# Patient Record
Sex: Female | Born: 1985 | Race: Black or African American | Hispanic: No | Marital: Single | Smoking: Former smoker
Health system: Southern US, Community
[De-identification: ages and names within clinical notes are randomized; demographics above are authoritative.]

## PROBLEM LIST (undated history)

## (undated) ENCOUNTER — Inpatient Hospital Stay (HOSPITAL_COMMUNITY): Payer: Self-pay

## (undated) DIAGNOSIS — A6009 Herpesviral infection of other urogenital tract: Secondary | ICD-10-CM

## (undated) DIAGNOSIS — Z789 Other specified health status: Secondary | ICD-10-CM

## (undated) HISTORY — DX: Herpesviral infection of other urogenital tract: A60.09

## (undated) HISTORY — PX: NO PAST SURGERIES: SHX2092

---

## 2015-09-02 ENCOUNTER — Encounter (HOSPITAL_COMMUNITY): Payer: Self-pay | Admitting: *Deleted

## 2015-09-02 ENCOUNTER — Inpatient Hospital Stay (HOSPITAL_COMMUNITY)
Admission: AD | Admit: 2015-09-02 | Discharge: 2015-09-02 | Disposition: A | Discharge status: Home / Self Care | Payer: Self-pay | Source: Ambulatory Visit | Attending: Obstetrics & Gynecology | Admitting: Obstetrics & Gynecology

## 2015-09-02 ENCOUNTER — Inpatient Hospital Stay (HOSPITAL_COMMUNITY): Payer: Self-pay

## 2015-09-02 DIAGNOSIS — O209 Hemorrhage in early pregnancy, unspecified: Secondary | ICD-10-CM

## 2015-09-02 DIAGNOSIS — O4691 Antepartum hemorrhage, unspecified, first trimester: Secondary | ICD-10-CM | POA: Insufficient documentation

## 2015-09-02 DIAGNOSIS — O3680X0 Pregnancy with inconclusive fetal viability, not applicable or unspecified: Secondary | ICD-10-CM | POA: Insufficient documentation

## 2015-09-02 DIAGNOSIS — Z87891 Personal history of nicotine dependence: Secondary | ICD-10-CM | POA: Insufficient documentation

## 2015-09-02 DIAGNOSIS — Z91018 Allergy to other foods: Secondary | ICD-10-CM | POA: Insufficient documentation

## 2015-09-02 DIAGNOSIS — Z3A01 Less than 8 weeks gestation of pregnancy: Secondary | ICD-10-CM | POA: Insufficient documentation

## 2015-09-02 DIAGNOSIS — R109 Unspecified abdominal pain: Secondary | ICD-10-CM | POA: Insufficient documentation

## 2015-09-02 HISTORY — DX: Other specified health status: Z78.9

## 2015-09-02 LAB — CBC
HCT: 39.8 % (ref 36.0–46.0)
HEMOGLOBIN: 13.5 g/dL (ref 12.0–15.0)
MCH: 30.1 pg (ref 26.0–34.0)
MCHC: 33.9 g/dL (ref 30.0–36.0)
MCV: 88.8 fL (ref 78.0–100.0)
PLATELETS: 299 10*3/uL (ref 150–400)
RBC: 4.48 MIL/uL (ref 3.87–5.11)
RDW: 12.8 % (ref 11.5–15.5)
WBC: 8.5 10*3/uL (ref 4.0–10.5)

## 2015-09-02 LAB — URINALYSIS, ROUTINE W REFLEX MICROSCOPIC
BILIRUBIN URINE: NEGATIVE
GLUCOSE, UA: NEGATIVE mg/dL
Ketones, ur: NEGATIVE mg/dL
Nitrite: NEGATIVE
PROTEIN: NEGATIVE mg/dL
SPECIFIC GRAVITY, URINE: 1.025 (ref 1.005–1.030)
pH: 5.5 (ref 5.0–8.0)

## 2015-09-02 LAB — POCT PREGNANCY, URINE: Preg Test, Ur: POSITIVE — AB

## 2015-09-02 LAB — WET PREP, GENITAL
CLUE CELLS WET PREP: NONE SEEN
Sperm: NONE SEEN
TRICH WET PREP: NONE SEEN
Yeast Wet Prep HPF POC: NONE SEEN

## 2015-09-02 LAB — URINE MICROSCOPIC-ADD ON: BACTERIA UA: NONE SEEN

## 2015-09-02 LAB — HCG, QUANTITATIVE, PREGNANCY: hCG, Beta Chain, Quant, S: 299 m[IU]/mL — ABNORMAL HIGH (ref <5)

## 2015-09-02 NOTE — MAU Provider Note (Signed)
Chief Complaint: Vaginal Bleeding and Abdominal Pain   First Provider Initiated Contact with Patient 09/02/15 1956     SUBJECTIVE HPI HPI: Kristy James is a 29 y.o. G3P0 at [redacted]w[redacted]d by LMP who presents to maternity admissions reporting bleeding which started today. Initially spotting then heavier.  Denies pain.  Was not trying to get pregnant.  She denies vaginal bleeding, vaginal itching/burning, urinary symptoms, h/a, dizziness, n/v, or fever/chills.    RN Note: Pos HPT last week, started bleeding today, also cramping. Has to wear a panti liner, bleeding like a light period.         Past Medical History  Diagnosis Date  . Medical history non-contributory    Past Surgical History  Procedure Laterality Date  . No past surgeries     Social History   Social History  . Marital Status: Single    Spouse Name: N/A  . Number of Children: N/A  . Years of Education: N/A   Occupational History  . Not on file.   Social History Main Topics  . Smoking status: Former Research scientist (life sciences)  . Smokeless tobacco: Not on file  . Alcohol Use: No  . Drug Use: No  . Sexual Activity: Not on file   Other Topics Concern  . Not on file   Social History Narrative  . No narrative on file   No current facility-administered medications on file prior to encounter.   No current outpatient prescriptions on file prior to encounter.   Allergies  Allergen Reactions  . Avocado Hives    ROS:  Review of Systems Review of Systems  Constitutional: Negative for fever and chills.  Gastrointestinal: Negative for nausea, vomiting, abdominal pain, diarrhea and constipation.  Genitourinary: Negative for dysuria. Positive for bleeding, negative for pain. Musculoskeletal: Negative for back pain.  Neurological: Negative for dizziness and weakness.    I have reviewed patient's Past Medical Hx, Surgical Hx, Family Hx, Social Hx, medications and allergies.   Physical Exam  Patient Vitals for the past 24 hrs:  BP  Temp Temp src Pulse Resp Height Weight  09/02/15 1827 119/62 mmHg 98.3 F (36.8 C) Oral 73 16 5\' 3"  (1.6 m) 155 lb 3.2 oz (70.398 kg)    Physical Exam  Constitutional: Well-developed, well-nourished female in no acute distress.  Cardiovascular: normal rate Respiratory: normal effort GI: Abd soft, non-tender. Pos BS x 4 MS: Extremities nontender, no edema, normal ROM Neurologic: Alert and oriented x 4.  GU: Neg CVAT.  PELVIC EXAM: Cervix pink, visually closed, without lesion, small amount of  Bloody discharge, vaginal walls and external genitalia normal Bimanual exam: Cervix 0/long/high, firm, anterior, neg CMT, uterus nontender, nonenlarged, adnexa without tenderness, enlargement, or mass   LAB RESULTS Results for orders placed or performed during the hospital encounter of 09/02/15 (from the past 24 hour(s))  Urinalysis, Routine w reflex microscopic (not at [facility])     Status: Abnormal   Collection Time: 09/02/15  6:30 PM  Result Value Ref Range   Color, Urine YELLOW YELLOW   APPearance CLEAR CLEAR   Specific Gravity, Urine 1.025 1.005 - 1.030   pH 5.5 5.0 - 8.0   Glucose, UA NEGATIVE NEGATIVE mg/dL   Hgb urine dipstick LARGE (A) NEGATIVE   Bilirubin Urine NEGATIVE NEGATIVE   Ketones, ur NEGATIVE NEGATIVE mg/dL   Protein, ur NEGATIVE NEGATIVE mg/dL   Nitrite NEGATIVE NEGATIVE   Leukocytes, UA SMALL (A) NEGATIVE  Urine microscopic-add on     Status: Abnormal   Collection Time: 09/02/15  6:30 PM  Result Value Ref Range   Squamous Epithelial / LPF 0-5 (A) NONE SEEN   WBC, UA 0-5 0 - 5 WBC/hpf   RBC / HPF 6-30 0 - 5 RBC/hpf   Bacteria, UA NONE SEEN NONE SEEN  Pregnancy, urine POC     Status: Abnormal   Collection Time: 09/02/15  6:40 PM  Result Value Ref Range   Preg Test, Ur POSITIVE (A) NEGATIVE  CBC     Status: None   Collection Time: 09/02/15  8:27 PM  Result Value Ref Range   WBC 8.5 4.0 - 10.5 K/uL   RBC 4.48 3.87 - 5.11 MIL/uL   Hemoglobin 13.5 12.0 - 15.0 g/dL    HCT 39.8 36.0 - 46.0 %   MCV 88.8 78.0 - 100.0 fL   MCH 30.1 26.0 - 34.0 pg   MCHC 33.9 30.0 - 36.0 g/dL   RDW 12.8 11.5 - 15.5 %   Platelets 299 150 - 400 K/uL  ABO/Rh     Status: None (Preliminary result)   Collection Time: 09/02/15  8:27 PM  Result Value Ref Range   ABO/RH(D) B POS   Wet prep, genital     Status: Abnormal   Collection Time: 09/02/15  8:30 PM  Result Value Ref Range   Yeast Wet Prep HPF POC NONE SEEN NONE SEEN   Trich, Wet Prep NONE SEEN NONE SEEN   Clue Cells Wet Prep HPF POC NONE SEEN NONE SEEN   WBC, Wet Prep HPF POC FEW (A) NONE SEEN   Sperm NONE SEEN     --/--/B POS (12/29 2027)  IMAGING US Ob Comp Less 14 Wks  09/02/2015  CLINICAL DATA:  29 year old female with vaginal bleeding. EXAM: OBSTETRIC <14 WK [place] AND TRANSVAGINAL OB US TECHNIQUE: Both transabdominal and transvaginal ultrasound examinations were performed for complete evaluation of the gestation as well as the maternal uterus, adnexal regions, and pelvic cul-de-sac. Transvaginal technique was performed to assess early pregnancy. COMPARISON:  None. FINDINGS: The uterus is anteverted. There is a 1.1 x 1.2 x 1.1 cm posterior fibroid. The endometrium measures 4 mm in thickness. No intrauterine pregnancy identified. The HCG levels are pending at this time. With a positive HCG and no documented intrauterine pregnancy, the possibility of an ectopic pregnancy is not excluded. Correlation with serial HCG levels and follow-up with ultrasound is recommended. The ovaries appear unremarkable. The right ovary measures 1.8 x 3.1 x 1.8 cm and the left ovary measures 2.6 x 1.4 x 2.1 cm. No significant free fluid noted within the pelvis. IMPRESSION: No intrauterine pregnancy identified. Correlation with clinical exam and follow-up with serial HCG and ultrasound recommended. Electronically Signed   By: Anner Crete M.D.   On: 09/02/2015 21:25   US Ob Transvaginal  09/02/2015  CLINICAL DATA:  29 year old female with  vaginal bleeding. EXAM: OBSTETRIC <14 WK [place] AND TRANSVAGINAL OB US TECHNIQUE: Both transabdominal and transvaginal ultrasound examinations were performed for complete evaluation of the gestation as well as the maternal uterus, adnexal regions, and pelvic cul-de-sac. Transvaginal technique was performed to assess early pregnancy. COMPARISON:  None. FINDINGS: The uterus is anteverted. There is a 1.1 x 1.2 x 1.1 cm posterior fibroid. The endometrium measures 4 mm in thickness. No intrauterine pregnancy identified. The HCG levels are pending at this time. With a positive HCG and no documented intrauterine pregnancy, the possibility of an ectopic pregnancy is not excluded. Correlation with serial HCG levels and follow-up with ultrasound is recommended. The ovaries appear  unremarkable. The right ovary measures 1.8 x 3.1 x 1.8 cm and the left ovary measures 2.6 x 1.4 x 2.1 cm. No significant free fluid noted within the pelvis. IMPRESSION: No intrauterine pregnancy identified. Correlation with clinical exam and follow-up with serial HCG and ultrasound recommended. Electronically Signed   By: Anner Crete M.D.   On: 09/02/2015 21:25    MAU Management/MDM: Ordered labs and reviewed results.  Cultures done  This bleeding could represent a normal pregnancy with bleeding, spontaneous abortion or even an ectopic which can be life-threatening.   Cultures were done to rule out pelvic infection Blood drawn for Quant HCG, CBC, ABO/Rh  Quant cannot be done here Sent to [facility]  US shows no gestational sac or anything else but a fibroid  Will let her go home and repeat quant in 2 days  ASSESSMENT 1. Bleeding in early pregnancy   2. Pregnancy of unknown location  PLAN D/C home Return to MAU Saturday for Quant    Medication List    Notice    You have not been prescribed any medications.      Hansel Feinstein CNM, MSN Certified Nurse-Midwife 09/02/2015  9:11 PM  Waiting on Quant HCG and [place]. Report  given to oncoming CNM Seabron Spates, CNM

## 2015-09-02 NOTE — Discharge Instructions (Signed)

## 2015-09-02 NOTE — MAU Note (Signed)
Pos HPT last week, started bleeding today, also cramping.  Has to wear a panti liner, bleeding like a light period.

## 2015-09-03 LAB — ABO/RH: ABO/RH(D): B POS

## 2015-09-03 LAB — HIV ANTIBODY (ROUTINE TESTING W REFLEX): HIV Screen 4th Generation wRfx: NONREACTIVE

## 2015-09-04 ENCOUNTER — Encounter (HOSPITAL_COMMUNITY): Payer: Self-pay | Admitting: Student

## 2015-09-04 ENCOUNTER — Inpatient Hospital Stay (HOSPITAL_COMMUNITY)
Admission: AD | Admit: 2015-09-04 | Discharge: 2015-09-04 | Disposition: A | Discharge status: Home / Self Care | Payer: Self-pay | Source: Ambulatory Visit | Attending: Obstetrics & Gynecology | Admitting: Obstetrics & Gynecology

## 2015-09-04 DIAGNOSIS — O039 Complete or unspecified spontaneous abortion without complication: Secondary | ICD-10-CM | POA: Insufficient documentation

## 2015-09-04 DIAGNOSIS — Z09 Encounter for follow-up examination after completed treatment for conditions other than malignant neoplasm: Secondary | ICD-10-CM | POA: Insufficient documentation

## 2015-09-04 DIAGNOSIS — Z87891 Personal history of nicotine dependence: Secondary | ICD-10-CM | POA: Insufficient documentation

## 2015-09-04 LAB — HCG, QUANTITATIVE, PREGNANCY: HCG, BETA CHAIN, QUANT, S: 158 m[IU]/mL — AB (ref <5)

## 2015-09-04 LAB — GC/CHLAMYDIA PROBE AMP (~~LOC~~) NOT AT ARMC
Chlamydia: NEGATIVE
NEISSERIA GONORRHEA: NEGATIVE

## 2015-09-04 NOTE — Discharge Instructions (Signed)
Miscarriage  A miscarriage is the sudden loss of an unborn baby (fetus) before the 20th week of pregnancy. Most miscarriages happen in the first 3 months of pregnancy. Sometimes, it happens before a woman even knows she is pregnant. A miscarriage is also called a "spontaneous miscarriage" or "early pregnancy loss." Having a miscarriage can be an emotional experience. Talk with your caregiver about any questions you may have about miscarrying, the grieving process, and your future pregnancy plans.  CAUSES    Problems with the fetal chromosomes that make it impossible for the baby to develop normally. Problems with the baby's genes or chromosomes are most often the result of errors that occur, by chance, as the embryo divides and grows. The problems are not inherited from the parents.   Infection of the cervix or uterus.    Hormone problems.    Problems with the cervix, such as having an incompetent cervix. This is when the tissue in the cervix is not strong enough to hold the pregnancy.    Problems with the uterus, such as an abnormally shaped uterus, uterine fibroids, or congenital abnormalities.    Certain medical conditions.    Smoking, drinking alcohol, or taking illegal drugs.    Trauma.   Often, the cause of a miscarriage is unknown.   SYMPTOMS    Vaginal bleeding or spotting, with or without cramps or pain.   Pain or cramping in the abdomen or lower back.   Passing fluid, tissue, or blood clots from the vagina.  DIAGNOSIS   Your caregiver will perform a physical exam. You may also have an ultrasound to confirm the miscarriage. Blood or urine tests may also be ordered.  TREATMENT    Sometimes, treatment is not necessary if you naturally pass all the fetal tissue that was in the uterus. If some of the fetus or placenta remains in the body (incomplete miscarriage), tissue left behind may become infected and must be removed. Usually, a dilation and curettage (D and C) procedure is performed.  During a D and C procedure, the cervix is widened (dilated) and any remaining fetal or placental tissue is gently removed from the uterus.   Antibiotic medicines are prescribed if there is an infection. Other medicines may be given to reduce the size of the uterus (contract) if there is a lot of bleeding.   If you have Rh negative blood and your baby was Rh positive, you will need a Rh immunoglobulin shot. This shot will protect any future baby from having Rh blood problems in future pregnancies.  HOME CARE INSTRUCTIONS    Your caregiver may order bed rest or may allow you to continue light activity. Resume activity as directed by your caregiver.   Have someone help with home and family responsibilities during this time.    Keep track of the number of sanitary pads you use each day and how soaked (saturated) they are. Write down this information.    Do not use tampons. Do not douche or have sexual intercourse until approved by your caregiver.    Only take over-the-counter or prescription medicines for pain or discomfort as directed by your caregiver.    Do not take aspirin. Aspirin can cause bleeding.    Keep all follow-up appointments with your caregiver.    If you or your partner have problems with grieving, talk to your caregiver or seek counseling to help cope with the pregnancy loss. Allow enough time to grieve before trying to get pregnant again.     SEEK IMMEDIATE MEDICAL CARE IF:    You have severe cramps or pain in your back or abdomen.   You have a fever.   You pass large blood clots (walnut-sized or larger) ortissue from your vagina. Save any tissue for your caregiver to inspect.    Your bleeding increases.    You have a thick, bad-smelling vaginal discharge.   You become lightheaded, weak, or you faint.    You have chills.   MAKE SURE YOU:   Understand these instructions.   Will watch your condition.   Will get help right away if you are not doing well or get worse.     This  information is not intended to replace advice given to you by your health care provider. Make sure you discuss any questions you have with your health care provider.     Document Released: 02/14/2001 Document Revised: 12/16/2012 Document Reviewed: 10/10/2011  Elsevier Interactive Patient Education 2016 [employer]

## 2015-09-04 NOTE — MAU Provider Note (Signed)
History   [id number]   Chief Complaint  Patient presents with  . Follow-up    HPI Kristy James is a 29 y.o. female G3P2002 here for follow-up BHCG.  Upon review of the records patient was first seen on 12/29 for vaginal bleeding. BHCG on that day was 299. Ultrasound showed no IUP. GC/CT and wet prep were collected.  Results were negative. Pt discharged home to f/u in 48 hrs for BHCG. Pt here today with no report of abdominal pain. Reports continued vaginal bleeding like a period. All other systems negative.   Patient's last menstrual period was 07/24/2015.  OB History  Gravida Para Term Preterm AB SAB TAB Ectopic Multiple Living  3 2 2  0      2    # Outcome Date GA Lbr Len/2nd Weight Sex Delivery Anes PTL Lv  3 Current           2 Term           1 Term               Past Medical History  Diagnosis Date  . Medical history non-contributory     No family history on file.  Social History   Social History  . Marital Status: Single    Spouse Name: N/A  . Number of Children: N/A  . Years of Education: N/A   Social History Main Topics  . Smoking status: Former Research scientist (life sciences)  . Smokeless tobacco: None  . Alcohol Use: No  . Drug Use: No  . Sexual Activity: Not Asked   Other Topics Concern  . None   Social History Narrative    Allergies  Allergen Reactions  . Avocado Hives    No current facility-administered medications on file prior to encounter.   Current Outpatient Prescriptions on File Prior to Encounter  Medication Sig Dispense Refill  . Prenatal Vit-Fe Fumarate-FA (PRENATAL MULTIVITAMIN) TABS tablet Take 1 tablet by mouth daily at 12 noon.       Physical Exam   Filed Vitals:   09/04/15 1536  BP: 110/66  Pulse: 64  Temp: 98.1 F (36.7 C)  TempSrc: Oral  Resp: 18    Physical Exam  Constitutional: She appears well-developed and well-nourished. No distress.  Respiratory: Effort normal. No respiratory distress.  Musculoskeletal: Normal range of motion.   Skin: She is not diaphoretic.  Psychiatric: She has a normal mood and affect. Her behavior is normal. Judgment and thought content normal.    MAU Course  Procedures Component     Latest Ref Rng 09/02/2015 09/04/2015  HCG, Beta Chain, Quant, S     <5 mIU/mL 299 (H) 158 (H)   MDM B positive Significant drop in hormone level Denies abdominal pain Vaginal bleeding continuing VSS Will have pt f/u in clinic s/p miscarriage  Assessment and Plan  29 y.o. G3P2002 at [redacted]w[redacted]d wks Pregnancy Follow-up BHCG 1. Miscarriage     P: Discharge home Comfort pack given Discussed reasons to return Pelvic rest F/u in clinic in 1-2 wks - msg sent to clinic  Jorje Guild, NP 09/04/2015 4:19 PM

## 2015-09-04 NOTE — MAU Note (Signed)
Pt here for F/U BHCG, denies pain, pt states she is still bleeding - has changed 3 pads since 0700.

## 2015-09-16 ENCOUNTER — Encounter: Payer: Self-pay | Admitting: Family Medicine

## 2016-07-07 ENCOUNTER — Encounter (HOSPITAL_COMMUNITY): Payer: Self-pay

## 2017-07-30 IMAGING — US US OB COMP LESS 14 WK
1 series · 15 of 28 positions shown · non-contrast
Comparison: None.

CLINICAL DATA: 29-year-old female with vaginal bleeding.

EXAM:
OBSTETRIC <14 WK US AND TRANSVAGINAL OB US
TECHNIQUE: Both transabdominal and transvaginal ultrasound examinations were
performed for complete evaluation of the gestation as well as the
maternal uterus, adnexal regions, and pelvic cul-de-sac.
Transvaginal technique was performed to assess early pregnancy.

[Series 1: us ob comp less 14 wk · 15 of 61 slices shown]
[im 1/61]
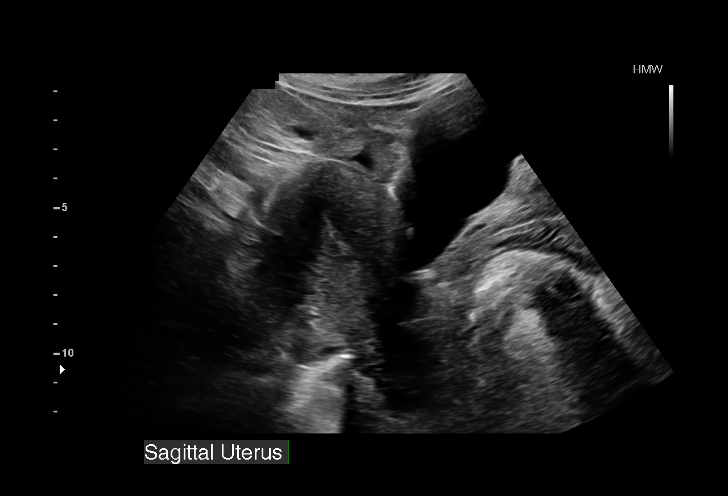
[im 5/61]
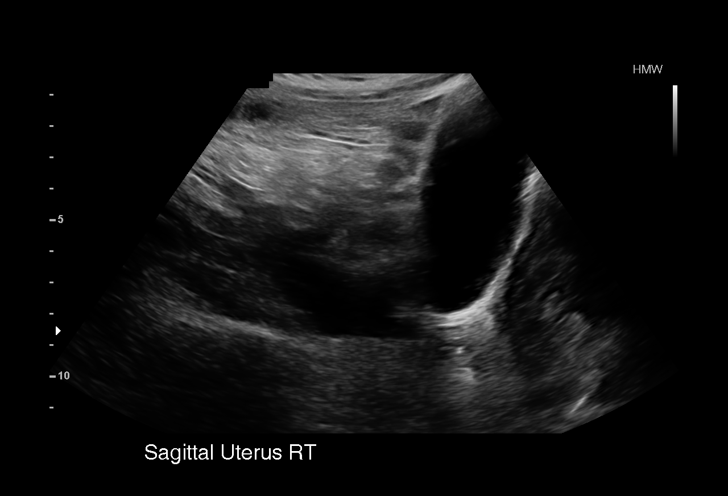
[im 9/61]
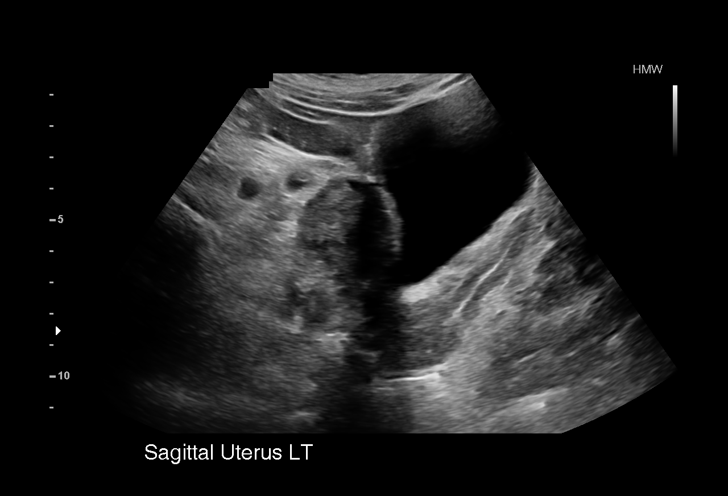
[im 14/61]
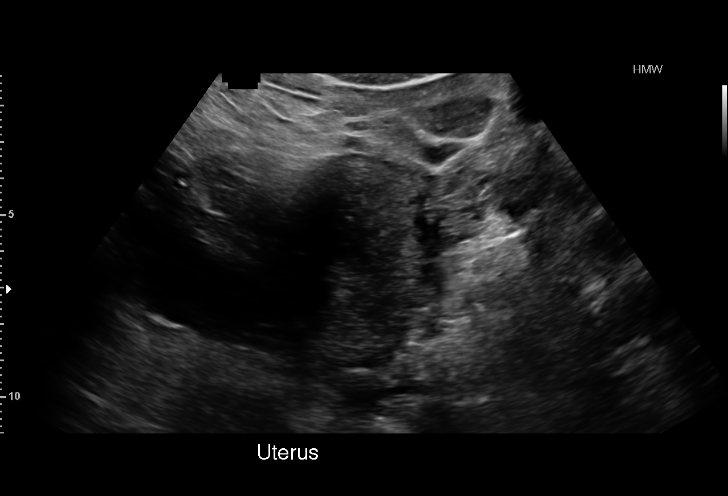
[im 18/61]
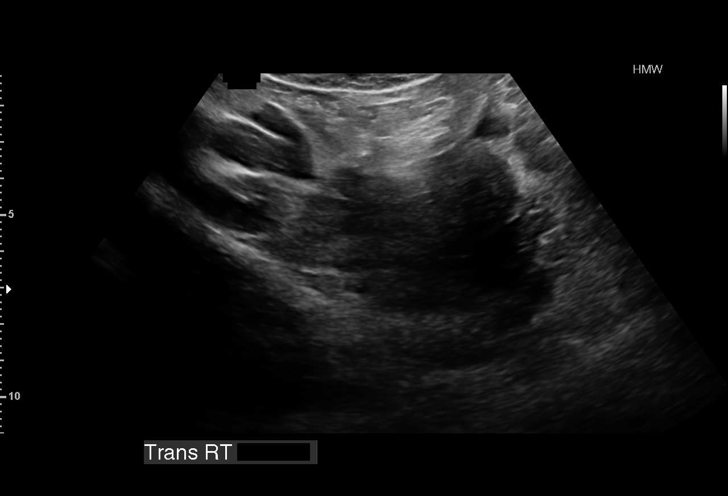
[im 23/61]
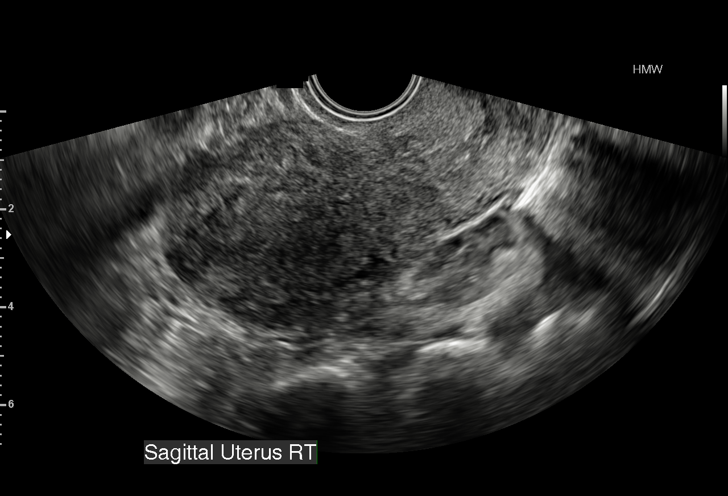
[im 27/61]
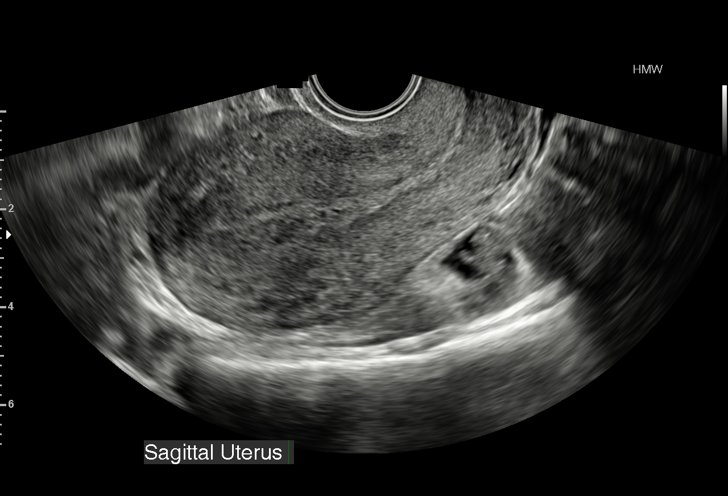
[im 32/61]
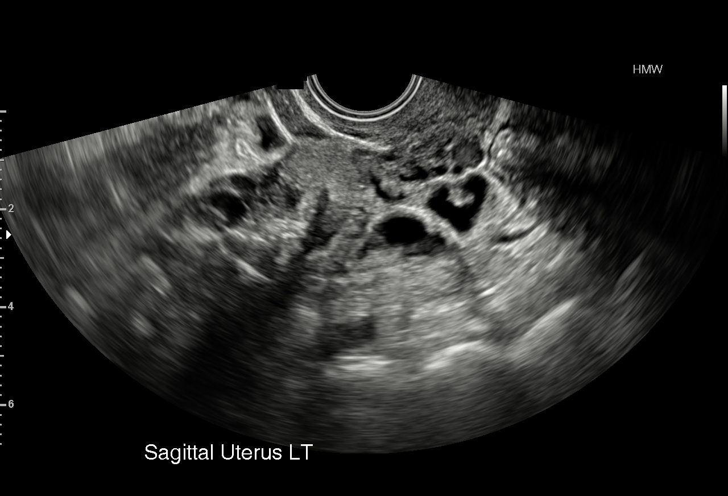
[im 34/61]
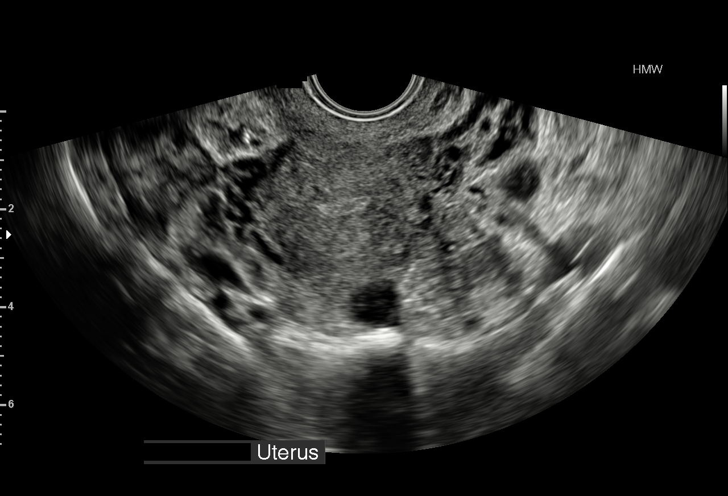
[im 38/61]
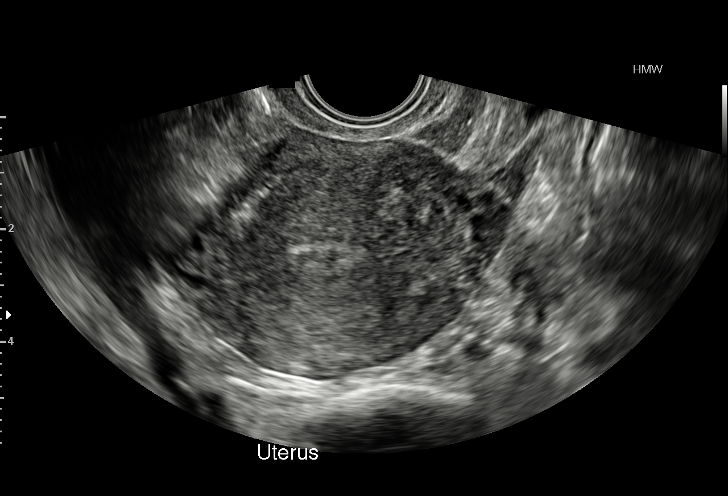
[im 43/61]
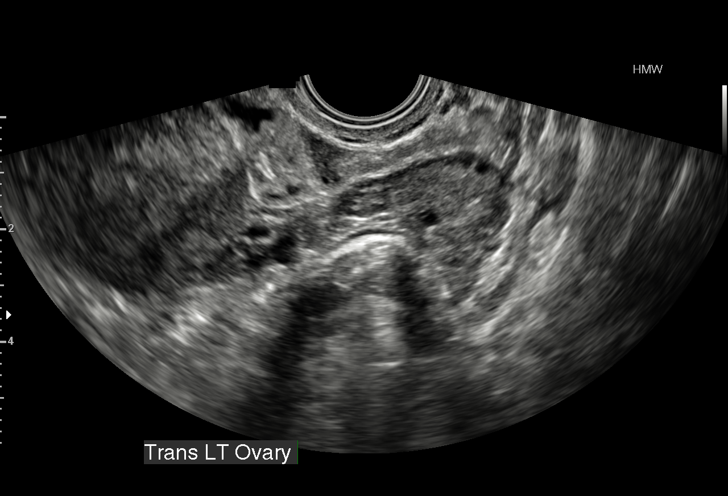
[im 47/61]
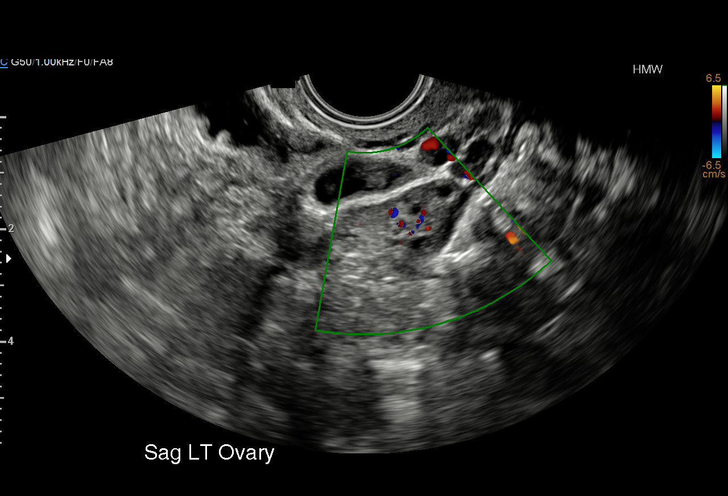
[im 52/61]
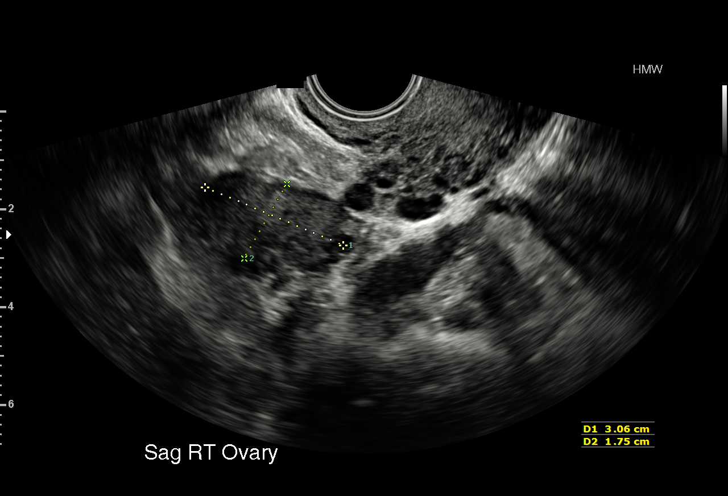
[im 56/61]
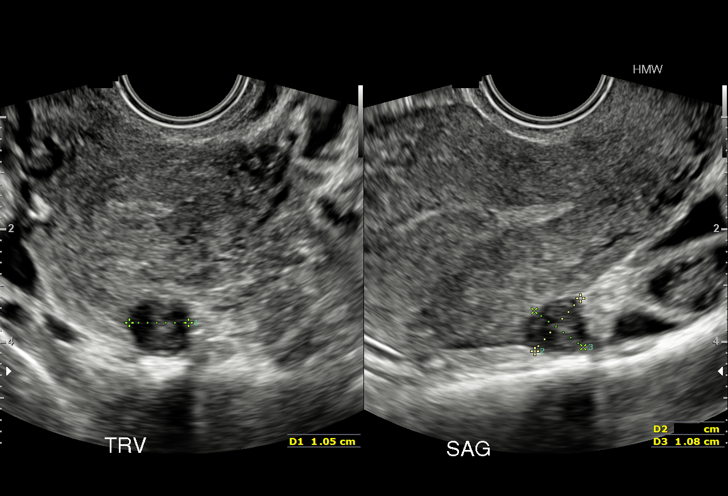
[im 61/61]
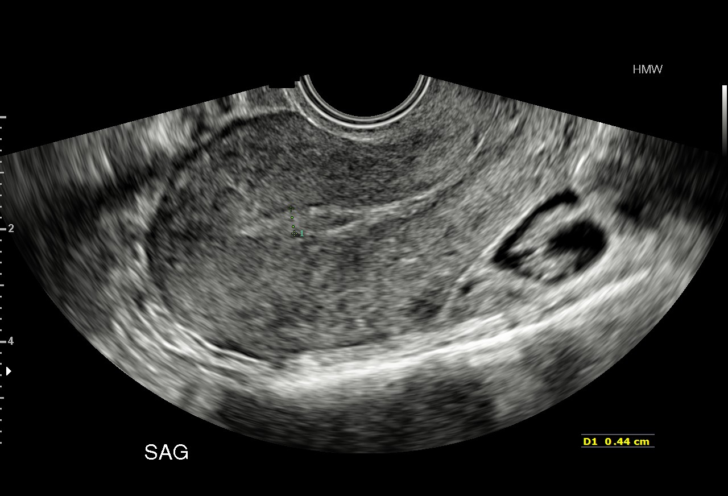

[15 of 28 positions shown; findings below may reference images not displayed]

FINDINGS: The uterus is anteverted. There is a 1.1 x 1.2 x 1.1 cm posterior
fibroid. The endometrium measures 4 mm in thickness. No intrauterine
pregnancy identified. The HCG levels are pending at this time. With
a positive HCG and no documented intrauterine pregnancy, the
possibility of an ectopic pregnancy is not excluded. Correlation
with serial HCG levels and follow-up with ultrasound is recommended.

The ovaries appear unremarkable. The right ovary measures 1.8 x
x 1.8 cm and the left ovary measures 2.6 x 1.4 x 2.1 cm.

No significant free fluid noted within the pelvis.
IMPRESSION: No intrauterine pregnancy identified. Correlation with clinical exam
and follow-up with serial HCG and ultrasound recommended.

## 2018-09-04 NOTE — L&D Delivery Note (Signed)
[facility]N [facility] Delivery Note  Kristy James is a 33 y.o. [id number] s/p NSVD at [redacted]w[redacted]d. She was admitted for spontaneous labor.   ROM: 0h 66m with clear fluid GBS Status: Negative/-- (12/08 1622) Maximum Maternal Temperature: 98.5 F    Labor Progress: . Patient arrived at 8 cm dilation and progressed spontaneously to fully dilated.   Delivery Date/Time: 08/29/2019 at 1229 Delivery: Called to room and patient was complete and pushing. Head delivered in OA position. No nuchal cord present. Shoulder and body delivered in usual fashion. Infant with spontaneous cry, placed on mother's abdomen, dried and stimulated. Cord clamped x 2 after 1-minute delay, and cut by FOB. Cord blood drawn. Placenta delivered spontaneously with gentle cord traction. Fundus firm with massage and Pitocin. Labia, perineum, vagina, and cervix inspected with no lacerations.   Placenta: 3v intact Complications: none Lacerations: none EBL: 209 cc Analgesia: none   Infant: APGAR (1 MIN): 8   APGAR (5 MINS): 9    Weight: 2795 grams  Augustin Coupe, MD/[facility] OB/GYN Fellow, Faculty Practice

## 2019-01-09 ENCOUNTER — Ambulatory Visit (INDEPENDENT_AMBULATORY_CARE_PROVIDER_SITE_OTHER): Payer: Medicaid Other

## 2019-01-09 ENCOUNTER — Other Ambulatory Visit: Payer: Self-pay

## 2019-01-09 ENCOUNTER — Encounter: Payer: Self-pay | Admitting: Family Medicine

## 2019-01-09 DIAGNOSIS — Z3201 Encounter for pregnancy test, result positive: Secondary | ICD-10-CM

## 2019-01-09 LAB — POCT PREGNANCY, URINE: Preg Test, Ur: POSITIVE — AB

## 2019-01-09 NOTE — Progress Notes (Signed)
Pt here today for pregnancy test.  Resulted positive.  Medications reconciled.  Pt reports LMP 11/29/2018  EDD 09/05/2019 5w 6d today.  List of medicines safe to take in pregnancy given to pt.  Proof of pregnancy letter provided by the front office.

## 2019-01-14 NOTE — Progress Notes (Signed)
I have reviewed the chart and agree with nursing staff's documentation of this patient's encounter.  Mora Bellman, MD 01/14/2019 9:14 AM

## 2019-01-31 ENCOUNTER — Telehealth (INDEPENDENT_AMBULATORY_CARE_PROVIDER_SITE_OTHER): Payer: Medicaid Other | Admitting: *Deleted

## 2019-01-31 ENCOUNTER — Other Ambulatory Visit: Payer: Self-pay

## 2019-01-31 DIAGNOSIS — Z3490 Encounter for supervision of normal pregnancy, unspecified, unspecified trimester: Secondary | ICD-10-CM | POA: Insufficient documentation

## 2019-01-31 DIAGNOSIS — Z3480 Encounter for supervision of other normal pregnancy, unspecified trimester: Secondary | ICD-10-CM

## 2019-01-31 NOTE — Progress Notes (Signed)
I have reviewed this chart and agree with the RN/CMA assessment and management.    K. Meryl Davis, M.D. Attending [facility] (Faculty Practice)   

## 2019-01-31 NOTE — Progress Notes (Signed)
I connected with  Belenda Cruise on 01/31/19 at 10:15 AM EDT by telephone and verified that I am speaking with the correct person using two identifiers.   I discussed the limitations, risks, security and privacy concerns of performing an evaluation and management service by telephone and virtually and the availability of in person appointments. I also discussed with the patient that there may be a patient responsible charge related to this service. The patient expressed understanding and agreed to proceed.  We verifed her LMP 11/29/18 and her EDD 09/05/19. She has regular periods and sure of that date.  She has access to a blood pressure cuff. I explained I will send her [employer] app and we will have her take her blood pressure weekly and enter into app. She voices understanding. She downloaded the App while we were online.  I reviewed with her our location, her new ob appt, to wear mask, covid screening, bring ID and insurance card.    Seraphim Affinito,RN 01/31/2019  10:19 AM

## 2019-02-04 ENCOUNTER — Telehealth: Payer: Medicaid Other

## 2019-02-17 ENCOUNTER — Telehealth: Payer: Self-pay | Admitting: Obstetrics and Gynecology

## 2019-02-17 NOTE — Telephone Encounter (Signed)
Patient was called and given the information for her visit on 02/18/2019. No symptoms.

## 2019-02-18 ENCOUNTER — Other Ambulatory Visit: Payer: Self-pay

## 2019-02-18 ENCOUNTER — Encounter: Payer: Self-pay | Admitting: Obstetrics and Gynecology

## 2019-02-18 ENCOUNTER — Ambulatory Visit (INDEPENDENT_AMBULATORY_CARE_PROVIDER_SITE_OTHER): Payer: Medicaid Other | Admitting: Obstetrics and Gynecology

## 2019-02-18 ENCOUNTER — Other Ambulatory Visit (HOSPITAL_COMMUNITY)
Admission: RE | Admit: 2019-02-18 | Discharge: 2019-02-18 | Disposition: A | Discharge status: Home / Self Care | Payer: Medicaid Other | Source: Ambulatory Visit | Attending: Obstetrics and Gynecology | Admitting: Obstetrics and Gynecology

## 2019-02-18 VITALS — BP 118/74 | HR 78 | Wt 154.0 lb

## 2019-02-18 DIAGNOSIS — Z3491 Encounter for supervision of normal pregnancy, unspecified, first trimester: Secondary | ICD-10-CM

## 2019-02-18 DIAGNOSIS — Z3481 Encounter for supervision of other normal pregnancy, first trimester: Secondary | ICD-10-CM

## 2019-02-18 DIAGNOSIS — A6009 Herpesviral infection of other urogenital tract: Secondary | ICD-10-CM | POA: Insufficient documentation

## 2019-02-18 DIAGNOSIS — Z3A11 11 weeks gestation of pregnancy: Secondary | ICD-10-CM

## 2019-02-18 DIAGNOSIS — Z3480 Encounter for supervision of other normal pregnancy, unspecified trimester: Secondary | ICD-10-CM | POA: Diagnosis present

## 2019-02-18 LAB — POCT URINALYSIS DIP (DEVICE)
Bilirubin Urine: NEGATIVE
Glucose, UA: NEGATIVE mg/dL
Ketones, ur: NEGATIVE mg/dL
Leukocytes,Ua: NEGATIVE
Nitrite: NEGATIVE
Protein, ur: NEGATIVE mg/dL
Specific Gravity, Urine: 1.025 (ref 1.005–1.030)
Urobilinogen, UA: 0.2 mg/dL (ref 0.0–1.0)
pH: 7 (ref 5.0–8.0)

## 2019-02-18 MED ORDER — AMBULATORY NON FORMULARY MEDICATION: 1.0000 | 0 refills | Status: DC

## 2019-02-18 NOTE — Progress Notes (Signed)
 INITIAL PRENATAL VISIT NOTE  Subjective:  Kristy James is a 33 y.o. G4P2012 at [redacted]w[redacted]d by LMP being seen today for her initial prenatal visit. This is an unplanned pregnancy. She and partner are happy with the pregnancy. She was using nothing for birth control previously. She has an obstetric history significant for 2 x SVD. She has a medical history significant for HSV, rare outbreaks.  Patient reports nausea but is is improving.  Contractions: Not present. Vag. Bleeding: None.  Movement: Absent. Denies leaking of fluid.    Past Medical History:  Diagnosis Date  . Herpes genitalis in women   . Medical history non-contributory     Past Surgical History:  Procedure Laterality Date  . NO PAST SURGERIES      OB History  Gravida Para Term Preterm AB Living  4 2 2 0 1 2  SAB TAB Ectopic Multiple Live Births  1       2    # Outcome Date GA Lbr Len/2nd Weight Sex Delivery Anes PTL Lv  4 Current           3 SAB 2017          2 Term 2012 [redacted]w[redacted]d  7 lb 10 oz (3.459 kg) M Vag-Spont EPI  LIV     Birth Comments: none  1 Term 2008 [redacted]w[redacted]d  7 lb 11 oz (3.487 kg) F Vag-Spont EPI  LIV     Birth Comments: none    Social History   Socioeconomic History  . Marital status: Single    Spouse name: Not on file  . Number of children: Not on file  . Years of education: Not on file  . Highest education level: Not on file  Occupational History  . Not on file  Social Needs  . Financial resource strain: Not on file  . Food insecurity    Worry: Never true    Inability: Never true  . Transportation needs    Medical: No    Non-medical: No  Tobacco Use  . Smoking status: Former Smoker    Types: Cigarettes  . Smokeless tobacco: Never Used  Substance and Sexual Activity  . Alcohol use: Not Currently    Comment: socially  . Drug use: No  . Sexual activity: Yes    Birth control/protection: None  Lifestyle  . Physical activity    Days per week: Not on file    Minutes per session: Not on file   . Stress: Not on file  Relationships  . Social connections    Talks on phone: Not on file    Gets together: Not on file    Attends religious service: Not on file    Active member of club or organization: Not on file    Attends meetings of clubs or organizations: Not on file    Relationship status: Not on file  Other Topics Concern  . Not on file  Social History Narrative  . Not on file   Family History  Problem Relation Age of Onset  . Hypotension Mother   . Diverticulitis Father     Current Outpatient Medications:  .  Prenatal Vit-Fe Fumarate-FA (PRENATAL MULTIVITAMIN) TABS tablet, Take 1 tablet by mouth daily at 12 noon., Disp: , Rfl:  .  Probiotic Product (PROBIOTIC-10 PO), Take by mouth., Disp: , Rfl:  .  AMBULATORY NON FORMULARY MEDICATION, 1 Device by Other route once a week. Blood Pressure Cuff Medium Monitored Regularly at home ICD 10:Z34.90 (Patient not taking:   Reported on 02/18/2019), Disp: 1 kit, Rfl: 0  No Active Allergies  Review of Systems: Negative except for what is mentioned in HPI.  Objective:   Vitals:   02/18/19 1053  BP: 118/74  Pulse: 78  Weight: 154 lb (69.9 kg)    Fetal Status: Fetal Heart Rate (bpm): 164   Movement: Absent     Physical Exam: BP 118/74   Pulse 78   Wt 154 lb (69.9 kg)   LMP 11/29/2018   BMI 27.28 kg/m  CONSTITUTIONAL: Well-developed, well-nourished female in no acute distress.  NEUROLOGIC: Alert and oriented to person, place, and time. Normal reflexes, muscle tone coordination. No cranial nerve deficit noted. PSYCHIATRIC: Normal mood and affect. Normal behavior. Normal judgment and thought content. SKIN: Skin is warm and dry. No rash noted. Not diaphoretic. No erythema. No pallor. HENT:  Normocephalic, atraumatic, External right and left ear normal. Oropharynx is clear and moist EYES: Conjunctivae and EOM are normal. Pupils are equal, round, and reactive to light. No scleral icterus.  NECK: Normal range of motion, supple,  no masses CARDIOVASCULAR: Normal heart rate noted, regular rhythm RESPIRATORY: Effort and breath sounds normal, no problems with respiration noted BREASTS: symmetric, non-tender, no masses palpable ABDOMEN: Soft, nontender, nondistended, gravid. GU: normal appearing external female genitalia, multiparous normal appearing cervix, scant white discharge in vagina, no lesions noted Bimanual: 12 weeks sized uterus, no adnexal tenderness or palpable lesions noted MUSCULOSKELETAL: Normal range of motion. EXT:  No edema and no tenderness. 2+ distal pulses.  Assessment and Plan:  Pregnancy: G4P2012 at [redacted]w[redacted]d by LMP  1. Supervision of other normal pregnancy, antepartum Reviewed [facility] practice structure, multiple providers, fellows, medical students, virtual visits, MyChart.  - Culture, OB Urine - Obstetric Panel, Including HIV - US MFM OB COMP + 14 WK; Future - Cytology - PAP - AMBULATORY NON FORMULARY MEDICATION; 1 Device by Other route once a week. Blood Pressure Cuff Medium Monitored Regularly at home ICD 10:Z34.90 (Patient not taking: Reported on 02/18/2019)  Dispense: 1 kit; Refill: 0 - Genetic Screening  2. Herpes genitalis in women - Rare outbreaks - ppx 36 weeks   Preterm labor symptoms and general obstetric precautions including but not limited to vaginal bleeding, contractions, leaking of fluid and fetal movement were reviewed in detail with the patient.  Please refer to After Visit Summary for other counseling recommendations.   Return in about 4 weeks (around 03/18/2019) for OB visit, virtual.  Kelly M Davis 02/18/2019 11:58 AM  

## 2019-02-19 LAB — OBSTETRIC PANEL, INCLUDING HIV
Antibody Screen: NEGATIVE
Basophils Absolute: 0 10*3/uL (ref 0.0–0.2)
Basos: 0 %
EOS (ABSOLUTE): 0.1 10*3/uL (ref 0.0–0.4)
Eos: 2 %
HIV Screen 4th Generation wRfx: NONREACTIVE
Hematocrit: 37.9 % (ref 34.0–46.6)
Hemoglobin: 13.1 g/dL (ref 11.1–15.9)
Hepatitis B Surface Ag: NEGATIVE
Immature Grans (Abs): 0 10*3/uL (ref 0.0–0.1)
Immature Granulocytes: 0 %
Lymphocytes Absolute: 1.5 10*3/uL (ref 0.7–3.1)
Lymphs: 20 %
MCH: 31.9 pg (ref 26.6–33.0)
MCHC: 34.6 g/dL (ref 31.5–35.7)
MCV: 92 fL (ref 79–97)
Monocytes Absolute: 0.5 10*3/uL (ref 0.1–0.9)
Monocytes: 7 %
Neutrophils Absolute: 5.1 10*3/uL (ref 1.4–7.0)
Neutrophils: 71 %
Platelets: 303 10*3/uL (ref 150–450)
RBC: 4.11 x10E6/uL (ref 3.77–5.28)
RDW: 13.2 % (ref 11.7–15.4)
RPR Ser Ql: NONREACTIVE
Rh Factor: POSITIVE
Rubella Antibodies, IGG: 3.17 index (ref >0.99)
WBC: 7.3 10*3/uL (ref 3.4–10.8)

## 2019-02-20 LAB — URINE CULTURE, OB REFLEX: Organism ID, Bacteria: NO GROWTH

## 2019-02-20 LAB — CYTOLOGY - PAP
Chlamydia: NEGATIVE
Diagnosis: NEGATIVE
HPV: NOT DETECTED
Neisseria Gonorrhea: NEGATIVE

## 2019-02-20 LAB — CULTURE, OB URINE

## 2019-03-04 ENCOUNTER — Telehealth: Payer: Self-pay

## 2019-03-04 DIAGNOSIS — O285 Abnormal chromosomal and genetic finding on antenatal screening of mother: Secondary | ICD-10-CM

## 2019-03-04 NOTE — Telephone Encounter (Signed)
Patient is requesting a call back from the nurse. She is requesting results she can't see in her MyChart.

## 2019-03-05 NOTE — Telephone Encounter (Addendum)
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  Genetic counseling appt scheduled for July 8th @ 0900.  LM for pt that we are calling with results.  03/10/19 @ 1052 LM for pt that I am calling with results and appt.  I stated that we have scheduled her an appt on July 8th @ 0900.  Her appt info is located in [place].  If she could please call the office or respond via [place].  MyChart message sent.

## 2019-03-12 ENCOUNTER — Other Ambulatory Visit: Payer: Self-pay

## 2019-03-12 ENCOUNTER — Ambulatory Visit (HOSPITAL_COMMUNITY): Payer: Self-pay | Admitting: Obstetrics and Gynecology

## 2019-03-12 ENCOUNTER — Ambulatory Visit (HOSPITAL_COMMUNITY): Payer: Medicaid Other | Attending: Obstetrics and Gynecology

## 2019-03-14 ENCOUNTER — Encounter: Payer: Self-pay | Admitting: *Deleted

## 2019-03-17 ENCOUNTER — Encounter: Payer: Self-pay | Admitting: Obstetrics and Gynecology

## 2019-03-17 DIAGNOSIS — Z148 Genetic carrier of other disease: Secondary | ICD-10-CM | POA: Insufficient documentation

## 2019-03-18 ENCOUNTER — Other Ambulatory Visit: Payer: Self-pay

## 2019-03-18 NOTE — Progress Notes (Signed)
Called patient for virtual visit and patient desires to reschedule.  Kristy James, CNM 03/18/19 10:15 AM

## 2019-03-24 ENCOUNTER — Encounter: Payer: Medicaid Other | Admitting: Nurse Practitioner

## 2019-03-24 ENCOUNTER — Encounter: Payer: Self-pay | Admitting: Family Medicine

## 2019-04-11 ENCOUNTER — Telehealth: Payer: Self-pay | Admitting: *Deleted

## 2019-04-11 ENCOUNTER — Other Ambulatory Visit: Payer: Self-pay

## 2019-04-11 ENCOUNTER — Inpatient Hospital Stay (HOSPITAL_BASED_OUTPATIENT_CLINIC_OR_DEPARTMENT_OTHER): Payer: Medicaid Other

## 2019-04-11 ENCOUNTER — Encounter (HOSPITAL_COMMUNITY): Payer: Self-pay | Admitting: *Deleted

## 2019-04-11 ENCOUNTER — Inpatient Hospital Stay (HOSPITAL_COMMUNITY)
Admission: AD | Admit: 2019-04-11 | Discharge: 2019-04-11 | Disposition: A | Discharge status: Home / Self Care | Payer: Medicaid Other | Attending: Obstetrics & Gynecology | Admitting: Obstetrics & Gynecology

## 2019-04-11 DIAGNOSIS — O26892 Other specified pregnancy related conditions, second trimester: Secondary | ICD-10-CM | POA: Diagnosis not present

## 2019-04-11 DIAGNOSIS — Z87891 Personal history of nicotine dependence: Secondary | ICD-10-CM | POA: Insufficient documentation

## 2019-04-11 DIAGNOSIS — R1030 Lower abdominal pain, unspecified: Secondary | ICD-10-CM | POA: Diagnosis present

## 2019-04-11 DIAGNOSIS — R109 Unspecified abdominal pain: Secondary | ICD-10-CM

## 2019-04-11 DIAGNOSIS — Z3A19 19 weeks gestation of pregnancy: Secondary | ICD-10-CM

## 2019-04-11 LAB — URINALYSIS, ROUTINE W REFLEX MICROSCOPIC
Bilirubin Urine: NEGATIVE
Glucose, UA: NEGATIVE mg/dL
Ketones, ur: NEGATIVE mg/dL
Nitrite: NEGATIVE
Protein, ur: NEGATIVE mg/dL
Specific Gravity, Urine: 1.005 (ref 1.005–1.030)
pH: 7 (ref 5.0–8.0)

## 2019-04-11 NOTE — MAU Note (Signed)
Having pain in lower abd for couple days. Worse when lying on sides in bed or sitting. Better with walking. Pain is crampy and intermittent. Denies LOF or bleeding

## 2019-04-11 NOTE — Telephone Encounter (Signed)
Kristy James called and left a message and sent a MyChart message she is [redacted] weeks pregnant . She is c/o having constant pain in lower abdomen/ pelvis/ hips that started in lower back and moved. Denies bleeding .  States is worse at night when she is trying to rest.  I called [place] and we discussed that she may be having round ligament pain. She denies bleeding, or contractions. States a little cramping at times and she does feel baby move good. I advised tylenol as needed, rest as able, position for comfort. I advised her if she feels her pain worsens or is severe or she has bleeding or contractions she needs to be evaluated and can call us if we are open or if we are closed go to MAU. I also informed her since I do not see a follow up ob appt I will send message to registrars to schedule. She voices understanding.

## 2019-04-11 NOTE — MAU Provider Note (Signed)
Chief Complaint:  Abdominal Pain   First Provider Initiated Contact with Patient 04/11/19 2225      HPI: Kristy James is a 33 y.o. [id number] at 18w0dby LMP who presents to maternity admissions reporting lower abdominal cramping starting today. She denies any associated symptoms. The pain is in her low abdomen, slightly more on the left side. It is not affected by movement and occurs even at rest. It is intermittent and does not radiate. She has not tried any treatments.  She reports good fetal movement.  HPI  Past Medical History: Past Medical History:  Diagnosis Date  . Herpes genitalis in women   . Medical history non-contributory     Past obstetric history: OB History  Gravida Para Term Preterm AB Living  '4 2 2 '$ 0 1 2  SAB TAB Ectopic Multiple Live Births  1       2    # Outcome Date GA Lbr Len/2nd Weight Sex Delivery Anes PTL Lv  4 Current           3 SAB 2017          2 Term 2012 [id number] g M Vag-Spont EPI  LIV     Birth Comments: none  1 Term 2008 [id number] g F Vag-Spont EPI  LIV     Birth Comments: none    Past Surgical History: Past Surgical History:  Procedure Laterality Date  . NO PAST SURGERIES      Family History: Family History  Problem Relation Age of Onset  . Hypotension Mother   . Diverticulitis Father     Social History: Social History   Tobacco Use  . Smoking status: Former Smoker    Types: Cigarettes  . Smokeless tobacco: Never Used  Substance Use Topics  . Alcohol use: Not Currently    Comment: socially  . Drug use: No    Allergies: No Known Allergies  Meds:  Medications Prior to Admission  Medication Sig Dispense Refill Last Dose  . Prenatal Vit-Fe Fumarate-FA (PRENATAL MULTIVITAMIN) TABS tablet Take 1 tablet by mouth daily at 12 noon.   04/11/2019 at Unknown time  . AMBULATORY NON FORMULARY MEDICATION 1 Device by Other route once a week. Blood Pressure Cuff Medium Monitored Regularly at home ICD 10:Z34.90 (Patient not taking:  Reported on 02/18/2019) 1 kit 0   . Probiotic Product (PROBIOTIC-10 PO) Take by mouth.       ROS:  Review of Systems  Constitutional: Negative for chills, fatigue and fever.  Eyes: Negative for visual disturbance.  Respiratory: Negative for shortness of breath.   Cardiovascular: Negative for chest pain.  Gastrointestinal: Positive for abdominal pain. Negative for nausea and vomiting.  Genitourinary: Positive for pelvic pain. Negative for difficulty urinating, dysuria, flank pain, vaginal bleeding, vaginal discharge and vaginal pain.  Neurological: Negative for dizziness and headaches.  Psychiatric/Behavioral: Negative.      I have reviewed patient's Past Medical Hx, Surgical Hx, Family Hx, Social Hx, medications and allergies.   Physical Exam   Patient Vitals for the past 24 hrs:  BP Temp Pulse Resp Height Weight  04/11/19 2150 133/73 98.3 F (36.8 C) 91 18 '5\' 4"'$  (1.626 m) 72.9 kg   Constitutional: Well-developed, well-nourished female in no acute distress.  Cardiovascular: normal rate Respiratory: normal effort GI: Abd soft, non-tender, gravid appropriate for gestational age.  MS: Extremities nontender, no edema, normal ROM Neurologic: Alert and oriented x 4.  GU: Neg CVAT.    Dilation: Closed Effacement (%):  50 Cervical Position: Posterior Station: Ballotable Exam by:: Danelle Berry -Baltazar Najjar CNM  FHT:  160 by doppler   Labs: Results for orders placed or performed during the hospital encounter of 04/11/19 (from the past 24 hour(s))  Urinalysis, Routine w reflex microscopic     Status: Abnormal   Collection Time: 04/11/19  9:39 PM  Result Value Ref Range   Color, Urine STRAW (A) YELLOW   APPearance CLEAR CLEAR   Specific Gravity, Urine 1.005 1.005 - 1.030   pH 7.0 5.0 - 8.0   Glucose, UA NEGATIVE NEGATIVE mg/dL   Hgb urine dipstick SMALL (A) NEGATIVE   Bilirubin Urine NEGATIVE NEGATIVE   Ketones, ur NEGATIVE NEGATIVE mg/dL   Protein, ur NEGATIVE NEGATIVE mg/dL    Nitrite NEGATIVE NEGATIVE   Leukocytes,Ua TRACE (A) NEGATIVE   RBC / HPF 0-5 0 - 5 RBC/hpf   WBC, UA 0-5 0 - 5 WBC/hpf   Bacteria, UA RARE (A) NONE SEEN   Squamous Epithelial / LPF 0-5 0 - 5   B/Positive/-- (06/16 1152)  Imaging:  No results found.  MAU Course/MDM: Orders Placed This Encounter  Procedures  . OB Urine Culture  . [place] MFM OB Limited  . Urinalysis, Routine w reflex microscopic  . Discharge patient    No orders of the defined types were placed in this encounter.    FHT wnl by doppler UA wnl, but with small hgb so will send for culture Cervix closed, but subjectively short on exam, cervical length by [place] ordered Cervix 3.5 cm by [place], so no evidence of preterm labor Pt without constipation or urinary symptoms Pain may be musculoskeletal/round ligament pain Rest/ice/heat/warm bath/Tylenol F/U in office, return to MAU for worsening symptoms  Pt discharge with strict return precautions.    Assessment: 1. Abdominal pain during pregnancy in second trimester     Plan: Discharge home Labor precautions and fetal kick counts Follow-up [facility] Follow up.   Specialty: Obstetrics and Gynecology Why: As scheduled, return to MAU as needed for emergencies Contact information: [address] [place], [place] [id number] [place] [phone]         Allergies as of 04/11/2019   No Known Allergies     Medication List    TAKE these medications   AMBULATORY NON FORMULARY MEDICATION 1 Device by Other route once a week. Blood Pressure Cuff Medium Monitored Regularly at home ICD 10:Z34.90   prenatal multivitamin Tabs tablet Take 1 tablet by mouth daily at 12 noon.   PROBIOTIC-10 PO Take by mouth.       Fatima Blank Certified Nurse-Midwife 04/11/2019 11:32 PM

## 2019-04-11 NOTE — Progress Notes (Signed)
Lisa Leftwich-Kirby CNM in earlier to discuss test results and d/c plan. Written and verbal d/c instructions given and understanding voiced. 

## 2019-04-13 LAB — CULTURE, OB URINE: Culture: <10000 — AB

## 2019-04-14 ENCOUNTER — Other Ambulatory Visit: Payer: Self-pay

## 2019-04-14 ENCOUNTER — Ambulatory Visit (HOSPITAL_COMMUNITY)
Admission: RE | Admit: 2019-04-14 | Discharge: 2019-04-14 | Disposition: A | Discharge status: Home / Self Care | Payer: Medicaid Other | Source: Ambulatory Visit | Attending: Obstetrics and Gynecology | Admitting: Obstetrics and Gynecology

## 2019-04-14 ENCOUNTER — Other Ambulatory Visit: Payer: Self-pay | Admitting: Obstetrics and Gynecology

## 2019-04-14 DIAGNOSIS — Z3A19 19 weeks gestation of pregnancy: Secondary | ICD-10-CM | POA: Diagnosis not present

## 2019-04-14 DIAGNOSIS — O3412 Maternal care for benign tumor of corpus uteri, second trimester: Secondary | ICD-10-CM

## 2019-04-14 DIAGNOSIS — Z3490 Encounter for supervision of normal pregnancy, unspecified, unspecified trimester: Secondary | ICD-10-CM | POA: Diagnosis not present

## 2019-04-14 DIAGNOSIS — Z148 Genetic carrier of other disease: Secondary | ICD-10-CM | POA: Diagnosis not present

## 2019-04-14 DIAGNOSIS — D259 Leiomyoma of uterus, unspecified: Secondary | ICD-10-CM | POA: Diagnosis not present

## 2019-04-15 ENCOUNTER — Other Ambulatory Visit (HOSPITAL_COMMUNITY): Payer: Self-pay | Admitting: *Deleted

## 2019-04-15 ENCOUNTER — Encounter: Payer: Medicaid Other | Admitting: Advanced Practice Midwife

## 2019-04-15 DIAGNOSIS — O43199 Other malformation of placenta, unspecified trimester: Secondary | ICD-10-CM

## 2019-04-16 ENCOUNTER — Encounter: Payer: Self-pay | Admitting: Obstetrics and Gynecology

## 2019-04-16 ENCOUNTER — Other Ambulatory Visit: Payer: Self-pay

## 2019-04-16 ENCOUNTER — Ambulatory Visit (INDEPENDENT_AMBULATORY_CARE_PROVIDER_SITE_OTHER): Payer: Medicaid Other | Admitting: Obstetrics & Gynecology

## 2019-04-16 VITALS — BP 127/73 | HR 98 | Wt 161.0 lb

## 2019-04-16 DIAGNOSIS — Z3A19 19 weeks gestation of pregnancy: Secondary | ICD-10-CM

## 2019-04-16 DIAGNOSIS — O43199 Other malformation of placenta, unspecified trimester: Secondary | ICD-10-CM | POA: Insufficient documentation

## 2019-04-16 DIAGNOSIS — Z3492 Encounter for supervision of normal pregnancy, unspecified, second trimester: Secondary | ICD-10-CM

## 2019-04-16 DIAGNOSIS — Z3480 Encounter for supervision of other normal pregnancy, unspecified trimester: Secondary | ICD-10-CM

## 2019-04-16 MED ORDER — BLOOD PRESSURE KIT DEVI: 1.0000 | Freq: Every day | 0 refills | Status: DC

## 2019-04-16 NOTE — Progress Notes (Signed)
   PRENATAL VISIT NOTE  Subjective:  Kristy James is a 33 y.o. [id number] at [redacted]w[redacted]d being seen today for ongoing prenatal care.  She is currently monitored for the following issues for this low-risk pregnancy and has Supervision of low-risk pregnancy; Herpes genitalis in women; Genetic carrier status; and Marginal insertion of umbilical cord affecting management of mother on their problem list.  Patient reports no complaints.  Contractions: Not present. Vag. Bleeding: None.  Movement: Present. Denies leaking of fluid.   The following portions of the patient's history were reviewed and updated as appropriate: allergies, current medications, past family history, past medical history, past social history, past surgical history and problem list.   Objective:   Vitals:   04/16/19 1329  BP: 127/73  Pulse: 98  Weight: 161 lb (73 kg)    Fetal Status: Fetal Heart Rate (bpm): 156   Movement: Present     General:  Alert, oriented and cooperative. Patient is in no acute distress.  Skin: Skin is warm and dry. No rash noted.   Cardiovascular: Normal heart rate noted  Respiratory: Normal respiratory effort, no problems with respiration noted  Abdomen: Soft, gravid, appropriate for gestational age.  Pain/Pressure: Present     Pelvic: Cervical exam deferred        Extremities: Normal range of motion.  Edema: None  Mental Status: Normal mood and affect. Normal behavior. Normal judgment and thought content.   Assessment and Plan:  Pregnancy: [id number] at [redacted]w[redacted]d 1. Supervision of other normal pregnancy, antepartum  - AFP, Serum, Open Spina Bifida  2. Encounter for supervision of low-risk pregnancy in second trimester   Preterm labor symptoms and general obstetric precautions including but not limited to vaginal bleeding, contractions, leaking of fluid and fetal movement were reviewed in detail with the patient. Please refer to After Visit Summary for other counseling recommendations.   Return in  about 4 weeks (around 05/14/2019) for virtual.  Future Appointments  Date Time Provider [place]  05/14/2019 10:10 AM [place] [facility]  05/14/2019 10:15 AM [facility]-MFC [place] 4 WH-MFCUS MFC-US    Emily Filbert, MD

## 2019-04-18 LAB — AFP, SERUM, OPEN SPINA BIFIDA
AFP MoM: 1.16
AFP Value: 63.1 ng/mL
Gest. Age on Collection Date: 19.5 weeks
Maternal Age At EDD: 31.5 yr
OSBR Risk 1 IN: 10000
Test Results:: NEGATIVE
Weight: 161 [lb_av]

## 2019-05-14 ENCOUNTER — Ambulatory Visit (HOSPITAL_COMMUNITY): Payer: Medicaid Other | Attending: Obstetrics and Gynecology

## 2019-05-14 ENCOUNTER — Telehealth: Payer: Medicaid Other | Admitting: Medical

## 2019-05-14 ENCOUNTER — Encounter (HOSPITAL_COMMUNITY): Payer: Self-pay

## 2019-05-14 ENCOUNTER — Ambulatory Visit (HOSPITAL_COMMUNITY): Payer: Medicaid Other

## 2019-05-21 ENCOUNTER — Other Ambulatory Visit: Payer: Self-pay

## 2019-05-21 ENCOUNTER — Encounter: Payer: Medicaid Other | Admitting: Medical

## 2019-05-21 ENCOUNTER — Encounter: Payer: Self-pay | Admitting: Medical

## 2019-05-21 ENCOUNTER — Telehealth: Payer: Self-pay | Admitting: Medical

## 2019-05-21 NOTE — Patient Instructions (Signed)
Please reschedule your missed appointment

## 2019-05-21 NOTE — Telephone Encounter (Signed)
Attempted to call patient about her missed appointment. No answer, left voicemail for patient to give the office a call back to be rescheduled. No show letter mailed.

## 2019-05-21 NOTE — Progress Notes (Signed)
1:39p- Called pt to se if she would like to start My Chart visit earlier, no answer, left VM that I will call her back closer to her appt.time.     2:08p- 2nd attempt, no answer, left VM TO RESCHEDULE.

## 2019-05-29 ENCOUNTER — Other Ambulatory Visit: Payer: Self-pay

## 2019-05-29 ENCOUNTER — Telehealth (INDEPENDENT_AMBULATORY_CARE_PROVIDER_SITE_OTHER): Payer: Medicaid Other | Admitting: Obstetrics and Gynecology

## 2019-05-29 DIAGNOSIS — Z3492 Encounter for supervision of normal pregnancy, unspecified, second trimester: Secondary | ICD-10-CM

## 2019-05-29 DIAGNOSIS — Z3A25 25 weeks gestation of pregnancy: Secondary | ICD-10-CM

## 2019-05-29 NOTE — Progress Notes (Signed)
   TELEHEALTH VIRTUAL OBSTETRICS VISIT ENCOUNTER NOTE  I connected with Belenda Cruise on 05/29/19 at 10:35 AM EDT by telephone at home and verified that I am speaking with the correct person using two identifiers.   I discussed the limitations, risks, security and privacy concerns of performing an evaluation and management service by telephone and the availability of in person appointments. I also discussed with the patient that there may be a patient responsible charge related to this service. The patient expressed understanding and agreed to proceed.  Subjective:  Kristy James is a 33 y.o. [id number] at [redacted]w[redacted]d being followed for ongoing prenatal care.  She is currently monitored for the following issues for this low-risk pregnancy and has Supervision of low-risk pregnancy; Herpes genitalis in women; Genetic carrier status; and Marginal insertion of umbilical cord affecting management of mother on their problem list.  Patient reports no complaints. Reports fetal movement. Denies any contractions, bleeding or leaking of fluid.   The following portions of the patient's history were reviewed and updated as appropriate: allergies, current medications, past family history, past medical history, past social history, past surgical history and problem list.   Objective:   General:  Alert, oriented and cooperative.   Mental Status: Normal mood and affect perceived. Normal judgment and thought content.  Rest of physical exam deferred due to type of encounter  Assessment and Plan:  Pregnancy: [id number] at [redacted]w[redacted]d  1. Encounter for supervision of low-risk pregnancy in second trimester  Doing well BP 114/69   Preterm labor symptoms and general obstetric precautions including but not limited to vaginal bleeding, contractions, leaking of fluid and fetal movement were reviewed in detail with the patient.  I discussed the assessment and treatment plan with the patient. The patient was provided an opportunity  to ask questions and all were answered. The patient agreed with the plan and demonstrated an understanding of the instructions. The patient was advised to call back or seek an in-person office evaluation/go to MAU at [facility] for any urgent or concerning symptoms. Please refer to After Visit Summary for other counseling recommendations.   I provided 12 minutes of non-face-to-face time during this encounter.  No follow-ups on file.  Future Appointments  Date Time Provider [place]  06/17/2019  8:20 AM [facility] LAB [facility]OC  06/17/2019  9:15 AM Ardean Larsen, Mervyn Skeeters, CNM [facility] [place], NP [facility], [place]

## 2019-05-29 NOTE — Progress Notes (Signed)
I connected with  Belenda Cruise on 05/29/19 at 10:35 AM EDT by telephone and verified that I am speaking with the correct person using two identifiers.   I discussed the limitations, risks, security and privacy concerns of performing an evaluation and management service by telephone and the availability of in person appointments. I also discussed with the patient that there may be a patient responsible charge related to this service. The patient expressed understanding and agreed to proceed.  Derinda Late, RN 05/29/2019  10:58 AM

## 2019-06-17 ENCOUNTER — Ambulatory Visit (INDEPENDENT_AMBULATORY_CARE_PROVIDER_SITE_OTHER): Payer: Medicaid Other | Admitting: Student

## 2019-06-17 ENCOUNTER — Other Ambulatory Visit: Payer: Self-pay

## 2019-06-17 ENCOUNTER — Other Ambulatory Visit: Payer: Medicaid Other

## 2019-06-17 DIAGNOSIS — Z148 Genetic carrier of other disease: Secondary | ICD-10-CM | POA: Diagnosis not present

## 2019-06-17 DIAGNOSIS — Z3A28 28 weeks gestation of pregnancy: Secondary | ICD-10-CM

## 2019-06-17 DIAGNOSIS — Z3493 Encounter for supervision of normal pregnancy, unspecified, third trimester: Secondary | ICD-10-CM

## 2019-06-17 DIAGNOSIS — Z23 Encounter for immunization: Secondary | ICD-10-CM

## 2019-06-17 DIAGNOSIS — Z3480 Encounter for supervision of other normal pregnancy, unspecified trimester: Secondary | ICD-10-CM

## 2019-06-17 DIAGNOSIS — Z3492 Encounter for supervision of normal pregnancy, unspecified, second trimester: Secondary | ICD-10-CM

## 2019-06-17 NOTE — Progress Notes (Signed)
Patient ID: Kristy James, female   DOB: [birth year], 33 y.o.   MRN: [id number]   PRENATAL VISIT NOTE  Subjective:  Kristy James is a 33 y.o. [id number] at [redacted]w[redacted]d being seen today for ongoing prenatal care.  She is currently monitored for the following issues for this low-risk pregnancy and has Supervision of low-risk pregnancy; Herpes genitalis in women; Genetic carrier status; and Marginal insertion of umbilical cord affecting management of mother on their problem list.  Patient reports no complaints.  Contractions: Not present. Vag. Bleeding: None.  Movement: Present. Denies leaking of fluid.   The following portions of the patient's history were reviewed and updated as appropriate: allergies, current medications, past family history, past medical history, past social history, past surgical history and problem list.   Objective:   Vitals:   06/17/19 0914  BP: 124/66  Pulse: 83  Temp: 97.7 F (36.5 C)  Weight: 170 lb 1.6 oz (77.2 kg)    Fetal Status: Fetal Heart Rate (bpm): 152 Fundal Height: 28 cm Movement: Present     General:  Alert, oriented and cooperative. Patient is in no acute distress.  Skin: Skin is warm and dry. No rash noted.   Cardiovascular: Normal heart rate noted  Respiratory: Normal respiratory effort, no problems with respiration noted  Abdomen: Soft, gravid, appropriate for gestational age.  Pain/Pressure: Present     Pelvic: Cervical exam deferred        Extremities: Normal range of motion.  Edema: None  Mental Status: Normal mood and affect. Normal behavior. Normal judgment and thought content.   Assessment and Plan:  Pregnancy: [id number] at [redacted]w[redacted]d 1. Genetic carrier status -  2. Encounter for supervision of low-risk pregnancy in second trimester -2 hour gtt in process -patient has BP cuff at home. Reviewed importance of taking BP weekly, warning values and what to do.  -patient declines birth control.  - Tdap vaccine greater than or equal to 7yo IM  Preterm  labor symptoms and general obstetric precautions including but not limited to vaginal bleeding, contractions, leaking of fluid and fetal movement were reviewed in detail with the patient. Please refer to After Visit Summary for other counseling recommendations.   Return in about 4 weeks (around 07/15/2019), or LROB on MY chart in 4 weeks.  No future appointments.  [place], CNM

## 2019-06-17 NOTE — Progress Notes (Signed)
Pt stated that she had a sharpe pain in hip area.

## 2019-06-18 LAB — CBC
Hematocrit: 34.8 % (ref 34.0–46.6)
Hemoglobin: 11.4 g/dL (ref 11.1–15.9)
MCH: 30.1 pg (ref 26.6–33.0)
MCHC: 32.8 g/dL (ref 31.5–35.7)
MCV: 92 fL (ref 79–97)
Platelets: 302 10*3/uL (ref 150–450)
RBC: 3.79 x10E6/uL (ref 3.77–5.28)
RDW: 13 % (ref 11.7–15.4)
WBC: 9.4 10*3/uL (ref 3.4–10.8)

## 2019-06-18 LAB — RPR: RPR Ser Ql: NONREACTIVE

## 2019-06-18 LAB — GLUCOSE TOLERANCE, 2 HOURS W/ 1HR
Glucose, 1 hour: 134 mg/dL (ref 65–179)
Glucose, 2 hour: 86 mg/dL (ref 65–152)
Glucose, Fasting: 76 mg/dL (ref 65–91)

## 2019-06-18 LAB — HIV ANTIBODY (ROUTINE TESTING W REFLEX): HIV Screen 4th Generation wRfx: NONREACTIVE

## 2019-07-15 ENCOUNTER — Telehealth (INDEPENDENT_AMBULATORY_CARE_PROVIDER_SITE_OTHER): Payer: Medicaid Other | Admitting: Student

## 2019-07-15 ENCOUNTER — Other Ambulatory Visit: Payer: Self-pay

## 2019-07-15 DIAGNOSIS — Z3493 Encounter for supervision of normal pregnancy, unspecified, third trimester: Secondary | ICD-10-CM | POA: Diagnosis not present

## 2019-07-15 DIAGNOSIS — Z3A32 32 weeks gestation of pregnancy: Secondary | ICD-10-CM | POA: Diagnosis not present

## 2019-07-15 DIAGNOSIS — Z3492 Encounter for supervision of normal pregnancy, unspecified, second trimester: Secondary | ICD-10-CM

## 2019-07-15 MED ORDER — VALACYCLOVIR HCL 500 MG PO TABS: 500.0000 mg | ORAL_TABLET | Freq: Two times a day (BID) | ORAL | 1 refills | Status: DC

## 2019-07-15 NOTE — Progress Notes (Signed)
I connected with  Belenda Cruise on 07/15/19 at  1:15 PM EST by telephone and verified that I am speaking with the correct person using two identifiers.  I discussed the limitations, risks, security and privacy concerns of performing an evaluation and management service by telephone and the availability of in person appointments. I also discussed with the patient that there may be a patient responsible charge related to this service. The patient expressed understanding and agreed to proceed.  Annabell Howells, RN 07/15/2019  1:13 PM

## 2019-07-15 NOTE — Progress Notes (Signed)
I connected with@ on 07/15/19 at  1:15 PM EST by: MyChart and verified that I am speaking with the correct person using two identifiers.  Patient is located at work and provider is located at [place].     The purpose of this virtual visit is to provide medical care while limiting exposure to the novel coronavirus. I discussed the limitations, risks, security and privacy concerns of performing an evaluation and management service by My Chart and the availability of in person appointments. I also discussed with the patient that there may be a patient responsible charge related to this service. By engaging in this virtual visit, you consent to the provision of healthcare.  Additionally, you authorize for your insurance to be billed for the services provided during this visit.  The patient expressed understanding and agreed to proceed.  The following staff members participated in the virtual visit:  Apolonio Schneiders     PRENATAL VISIT NOTE  Subjective:  Kristy James is a 33 y.o. [id number] at [redacted]w[redacted]d  for phone visit for ongoing prenatal care.  She is currently monitored for the following issues for this low-risk pregnancy and has Supervision of low-risk pregnancy; Herpes genitalis in women; Genetic carrier status; and Marginal insertion of umbilical cord affecting management of mother on their problem list.  Patient reports no complaints other than some mild cramping.  Contractions: Irritability. Vag. Bleeding: None.  Movement: Present. Denies leaking of fluid.   The following portions of the patient's history were reviewed and updated as appropriate: allergies, current medications, past family history, past medical history, past social history, past surgical history and problem list.   Objective:   Vitals:   07/15/19 1313  BP: 97/62  Pulse: (!) 102   Self-Obtained  Fetal Status:     Movement: Present     Assessment and Plan:  Pregnancy: [id number] at [redacted]w[redacted]d 1. Encounter for supervision of low-risk pregnancy in  second trimester -Given RX for  Valtrex; patient will start taking at 36 weeks.  -Does not want any birth control.  -Patient will continue to take her blood presusre at her job; she is not logging them in babyRX consistently but she will try.   Preterm labor symptoms and general obstetric precautions including but not limited to vaginal bleeding, contractions, leaking of fluid and fetal movement were reviewed in detail with the patient.  Return in about 4 weeks (around 08/12/2019), or in person LROB.  No future appointments.   Time spent on virtual visit: 15 minutes  Starr Lake, CNM

## 2019-08-12 ENCOUNTER — Other Ambulatory Visit (HOSPITAL_COMMUNITY)
Admission: RE | Admit: 2019-08-12 | Discharge: 2019-08-12 | Disposition: A | Discharge status: Home / Self Care | Payer: Medicaid Other | Source: Ambulatory Visit | Attending: Student | Admitting: Student

## 2019-08-12 ENCOUNTER — Ambulatory Visit (INDEPENDENT_AMBULATORY_CARE_PROVIDER_SITE_OTHER): Payer: Medicaid Other | Admitting: Student

## 2019-08-12 ENCOUNTER — Other Ambulatory Visit: Payer: Self-pay

## 2019-08-12 VITALS — BP 127/68 | HR 83 | Wt 178.6 lb

## 2019-08-12 DIAGNOSIS — Z3493 Encounter for supervision of normal pregnancy, unspecified, third trimester: Secondary | ICD-10-CM | POA: Diagnosis not present

## 2019-08-12 DIAGNOSIS — Z3A36 36 weeks gestation of pregnancy: Secondary | ICD-10-CM | POA: Diagnosis not present

## 2019-08-12 NOTE — Patient Instructions (Addendum)
-  Red raspberry leaf tea; drink twice a day.  -evening primrose capsules and take daily

## 2019-08-12 NOTE — Progress Notes (Signed)
   PRENATAL VISIT NOTE  Subjective:  Kristy James is a 33 y.o. [id number] at [redacted]w[redacted]d being seen today for ongoing prenatal care.  She is currently monitored for the following issues for this low-risk pregnancy and has Supervision of low-risk pregnancy; Herpes genitalis in women; Genetic carrier status; and Marginal insertion of umbilical cord affecting management of mother on their problem list.  Patient reports no complaints.  Contractions: Irritability. Vag. Bleeding: None.  Movement: Present. Denies leaking of fluid.   The following portions of the patient's history were reviewed and updated as appropriate: allergies, current medications, past family history, past medical history, past social history, past surgical history and problem list.   Objective:   Vitals:   08/12/19 1339  BP: 127/68  Pulse: 83  Weight: 178 lb 9.6 oz (81 kg)    Fetal Status: Fetal Heart Rate (bpm):  145 Fundal Height: 35 cm Movement: Present  Presentation: Vertex  General:  Alert, oriented and cooperative. Patient is in no acute distress.  Skin: Skin is warm and dry. No rash noted.   Cardiovascular: Normal heart rate noted  Respiratory: Normal respiratory effort, no problems with respiration noted  Abdomen: Soft, gravid, appropriate for gestational age.  Pain/Pressure: Present     Pelvic: Cervical exam performed Dilation: Closed Effacement (%): Thick    Extremities: Normal range of motion.  Edema: None  Mental Status: Normal mood and affect. Normal behavior. Normal judgment and thought content.   Assessment and Plan:  Pregnancy: [id number] at [redacted]w[redacted]d 1. Encounter for supervision of low-risk pregnancy in third trimester -Vertex by [employer] -Recommended natural methods of labor induction - GC/Chlamydia probe amp ([place])not at [facility] - Culture, beta strep (group b only)  Preterm labor symptoms and general obstetric precautions including but not limited to vaginal bleeding, contractions, leaking of fluid and  fetal movement were reviewed in detail with the patient. Please refer to After Visit Summary for other counseling recommendations.   Return in about 2 weeks (around 08/26/2019), or LROB My chart visit.  No future appointments.  Starr Lake, CNM

## 2019-08-13 LAB — GC/CHLAMYDIA PROBE AMP (~~LOC~~) NOT AT ARMC
Chlamydia: NEGATIVE
Comment: NEGATIVE
Comment: NORMAL
Neisseria Gonorrhea: NEGATIVE

## 2019-08-16 LAB — CULTURE, BETA STREP (GROUP B ONLY): Strep Gp B Culture: NEGATIVE

## 2019-08-26 ENCOUNTER — Encounter: Payer: Self-pay | Admitting: Obstetrics and Gynecology

## 2019-08-26 ENCOUNTER — Encounter: Payer: Medicaid Other | Admitting: Obstetrics and Gynecology

## 2019-08-26 ENCOUNTER — Other Ambulatory Visit: Payer: Self-pay

## 2019-08-26 NOTE — Progress Notes (Signed)
Called pt at 1600 prior to appt time; VM left stating I would call back at 1615.  Sent pt text invitation with MyChart link. Called pt at 1622; VM left stating I will call back at 1630.  Called pt at 1630; VM left stating pt will need to reschedule with front office. Call back number left.   Apolonio Schneiders RN 08/26/19

## 2019-08-27 NOTE — Progress Notes (Signed)
Patient did not keep her appointment today.    Lezlie Lye, NP 08/27/2019 9:41 AM

## 2019-08-29 ENCOUNTER — Inpatient Hospital Stay (HOSPITAL_COMMUNITY)
Admission: AD | Admit: 2019-08-29 | Discharge: 2019-08-30 | DRG: 806 | Disposition: A | Discharge status: Home / Self Care | Payer: Medicaid Other | Attending: Family Medicine | Admitting: Family Medicine

## 2019-08-29 ENCOUNTER — Encounter (HOSPITAL_COMMUNITY): Payer: Self-pay | Admitting: Family Medicine

## 2019-08-29 DIAGNOSIS — Z20828 Contact with and (suspected) exposure to other viral communicable diseases: Secondary | ICD-10-CM | POA: Diagnosis present

## 2019-08-29 DIAGNOSIS — A6000 Herpesviral infection of urogenital system, unspecified: Secondary | ICD-10-CM | POA: Diagnosis present

## 2019-08-29 DIAGNOSIS — Z148 Genetic carrier of other disease: Secondary | ICD-10-CM

## 2019-08-29 DIAGNOSIS — O9832 Other infections with a predominantly sexual mode of transmission complicating childbirth: Secondary | ICD-10-CM | POA: Diagnosis present

## 2019-08-29 DIAGNOSIS — O26893 Other specified pregnancy related conditions, third trimester: Secondary | ICD-10-CM | POA: Diagnosis present

## 2019-08-29 DIAGNOSIS — O43123 Velamentous insertion of umbilical cord, third trimester: Secondary | ICD-10-CM | POA: Diagnosis present

## 2019-08-29 DIAGNOSIS — Z3A39 39 weeks gestation of pregnancy: Secondary | ICD-10-CM

## 2019-08-29 DIAGNOSIS — Z87891 Personal history of nicotine dependence: Secondary | ICD-10-CM | POA: Diagnosis not present

## 2019-08-29 DIAGNOSIS — O3413 Maternal care for benign tumor of corpus uteri, third trimester: Secondary | ICD-10-CM | POA: Diagnosis present

## 2019-08-29 DIAGNOSIS — Z3490 Encounter for supervision of normal pregnancy, unspecified, unspecified trimester: Secondary | ICD-10-CM

## 2019-08-29 DIAGNOSIS — A6009 Herpesviral infection of other urogenital tract: Secondary | ICD-10-CM | POA: Diagnosis present

## 2019-08-29 DIAGNOSIS — D251 Intramural leiomyoma of uterus: Secondary | ICD-10-CM | POA: Diagnosis present

## 2019-08-29 DIAGNOSIS — O43199 Other malformation of placenta, unspecified trimester: Secondary | ICD-10-CM | POA: Diagnosis present

## 2019-08-29 LAB — TYPE AND SCREEN
ABO/RH(D): B POS
Antibody Screen: NEGATIVE

## 2019-08-29 LAB — RESPIRATORY PANEL BY RT PCR (FLU A&B, COVID)
Influenza A by PCR: NEGATIVE
Influenza B by PCR: NEGATIVE
SARS Coronavirus 2 by RT PCR: NEGATIVE

## 2019-08-29 LAB — CBC
HCT: 40.1 % (ref 36.0–46.0)
Hemoglobin: 13.6 g/dL (ref 12.0–15.0)
MCH: 30.9 pg (ref 26.0–34.0)
MCHC: 33.9 g/dL (ref 30.0–36.0)
MCV: 91.1 fL (ref 80.0–100.0)
Platelets: 309 10*3/uL (ref 150–400)
RBC: 4.4 MIL/uL (ref 3.87–5.11)
RDW: 13.1 % (ref 11.5–15.5)
WBC: 12.6 10*3/uL — ABNORMAL HIGH (ref 4.0–10.5)
nRBC: 0 % (ref 0.0–0.2)

## 2019-08-29 LAB — ABO/RH: ABO/RH(D): B POS

## 2019-08-29 MED ORDER — ACETAMINOPHEN 325 MG PO TABS
650.0000 mg | ORAL_TABLET | ORAL | Status: DC | PRN
  Administered 2019-08-29: 16:00:00 650 mg via ORAL
  Filled 2019-08-29: qty 2

## 2019-08-29 MED ORDER — OXYCODONE HCL 5 MG PO TABS
5.0000 mg | ORAL_TABLET | ORAL | Status: DC | PRN
  Administered 2019-08-29: 5 mg via ORAL
  Filled 2019-08-29: qty 1

## 2019-08-29 MED ORDER — COCONUT OIL OIL: 1.0000 "application " | TOPICAL_OIL | Status: DC | PRN

## 2019-08-29 MED ORDER — DIBUCAINE (PERIANAL) 1 % EX OINT: 1.0000 "application " | TOPICAL_OINTMENT | CUTANEOUS | Status: DC | PRN

## 2019-08-29 MED ORDER — PRENATAL MULTIVITAMIN CH
1.0000 | ORAL_TABLET | Freq: Every day | ORAL | Status: DC
  Administered 2019-08-30: 13:00:00 1 via ORAL
  Filled 2019-08-29: qty 1

## 2019-08-29 MED ORDER — FENTANYL CITRATE (PF) 100 MCG/2ML IJ SOLN
100.0000 ug | Freq: Once | INTRAMUSCULAR | Status: AC
  Administered 2019-08-29: 13:00:00 100 ug via INTRAVENOUS

## 2019-08-29 MED ORDER — OXYCODONE-ACETAMINOPHEN 5-325 MG PO TABS: 2.0000 | ORAL_TABLET | ORAL | Status: DC | PRN

## 2019-08-29 MED ORDER — ONDANSETRON HCL 4 MG/2ML IJ SOLN: 4.0000 mg | Freq: Four times a day (QID) | INTRAMUSCULAR | Status: DC | PRN

## 2019-08-29 MED ORDER — LACTATED RINGERS IV SOLN: 500.0000 mL | INTRAVENOUS | Status: DC | PRN

## 2019-08-29 MED ORDER — OXYCODONE-ACETAMINOPHEN 5-325 MG PO TABS: 1.0000 | ORAL_TABLET | ORAL | Status: DC | PRN

## 2019-08-29 MED ORDER — OXYCODONE HCL 5 MG PO TABS: 10.0000 mg | ORAL_TABLET | ORAL | Status: DC | PRN

## 2019-08-29 MED ORDER — LACTATED RINGERS IV SOLN: INTRAVENOUS | Status: DC

## 2019-08-29 MED ORDER — FLEET ENEMA 7-19 GM/118ML RE ENEM: 1.0000 | ENEMA | RECTAL | Status: DC | PRN

## 2019-08-29 MED ORDER — ACETAMINOPHEN 325 MG PO TABS: 650.0000 mg | ORAL_TABLET | ORAL | Status: DC | PRN

## 2019-08-29 MED ORDER — DIPHENHYDRAMINE HCL 25 MG PO CAPS: 25.0000 mg | ORAL_CAPSULE | Freq: Four times a day (QID) | ORAL | Status: DC | PRN

## 2019-08-29 MED ORDER — IBUPROFEN 600 MG PO TABS
600.0000 mg | ORAL_TABLET | Freq: Four times a day (QID) | ORAL | Status: DC
  Administered 2019-08-29 – 2019-08-30 (×4): 600 mg via ORAL
  Filled 2019-08-29 (×4): qty 1

## 2019-08-29 MED ORDER — FENTANYL CITRATE (PF) 100 MCG/2ML IJ SOLN
INTRAMUSCULAR | Status: AC
  Filled 2019-08-29: qty 2

## 2019-08-29 MED ORDER — WITCH HAZEL-GLYCERIN EX PADS: 1.0000 "application " | MEDICATED_PAD | CUTANEOUS | Status: DC | PRN

## 2019-08-29 MED ORDER — MAGNESIUM HYDROXIDE 400 MG/5ML PO SUSP: 30.0000 mL | ORAL | Status: DC | PRN

## 2019-08-29 MED ORDER — SIMETHICONE 80 MG PO CHEW: 80.0000 mg | CHEWABLE_TABLET | ORAL | Status: DC | PRN

## 2019-08-29 MED ORDER — LIDOCAINE HCL (PF) 1 % IJ SOLN
30.0000 mL | INTRAMUSCULAR | Status: AC | PRN
  Administered 2019-08-29: 13:00:00 30 mL via SUBCUTANEOUS
  Filled 2019-08-29: qty 30

## 2019-08-29 MED ORDER — ONDANSETRON HCL 4 MG PO TABS: 4.0000 mg | ORAL_TABLET | ORAL | Status: DC | PRN

## 2019-08-29 MED ORDER — SOD CITRATE-CITRIC ACID 500-334 MG/5ML PO SOLN: 30.0000 mL | ORAL | Status: DC | PRN

## 2019-08-29 MED ORDER — OXYTOCIN 40 UNITS IN NORMAL SALINE INFUSION - SIMPLE MED
2.5000 [IU]/h | INTRAVENOUS | Status: DC
  Filled 2019-08-29: qty 1000

## 2019-08-29 MED ORDER — OXYTOCIN BOLUS FROM INFUSION
500.0000 mL | Freq: Once | INTRAVENOUS | Status: AC
  Administered 2019-08-29: 12:00:00 500 mL via INTRAVENOUS

## 2019-08-29 MED ORDER — BENZOCAINE-MENTHOL 20-0.5 % EX AERO: 1.0000 "application " | INHALATION_SPRAY | CUTANEOUS | Status: DC | PRN

## 2019-08-29 MED ORDER — SENNOSIDES-DOCUSATE SODIUM 8.6-50 MG PO TABS
2.0000 | ORAL_TABLET | ORAL | Status: DC
  Administered 2019-08-30: 2 via ORAL
  Filled 2019-08-29: qty 2

## 2019-08-29 MED ORDER — ONDANSETRON HCL 4 MG/2ML IJ SOLN: 4.0000 mg | INTRAMUSCULAR | Status: DC | PRN

## 2019-08-29 NOTE — H&P (Signed)
LABOR AND DELIVERY ADMISSION HISTORY AND PHYSICAL NOTE  Kristy James is a 33 y.o. female [phone] with IUP at 78w0dby LMP c/w 19wk UKore[place]resenting for spontaneous labor.   She reports contractions starting around 0500.   She reports positive fetal movement. She denies leakage of fluid, vaginal bleeding.   She plans on breast feeding. Her contraception plan is: none.  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 Herpes genitalis in women   . Medical history non-contributory     Past Surgical History: Past Surgical History:  Procedure Laterality Date  . NO PAST SURGERIES      Obstetrical History: OB History    Gravida  4   Para  3   Term  3   Preterm  0   AB  1   Living  3     SAB  1   TAB      Ectopic      Multiple  0   Live Births  3           Social History: Social History   Socioeconomic History  . Marital status: Single    Spouse name: Not on file  . Number of children: Not on file  . Years of education: Not on file  . Highest education level: Not on file  Occupational History  . Not on file  Tobacco Use  . Smoking status: Former Smoker    Types: Cigarettes  . Smokeless tobacco: Never Used  Substance and Sexual Activity  . Alcohol use: Not Currently    Comment: socially  . Drug use: No  . Sexual activity: Yes    Birth control/protection: None  Other Topics Concern  . Not on file  Social History Narrative  . Not on file   Social Determinants of Health   Financial Resource Strain:   . Difficulty of Paying Living Expenses: Not on file  Food Insecurity: No Food Insecurity  . Worried About RCharity fundraiserin the Last Year: Never true  . Ran Out of Food in the Last  Year: Never true  Transportation Needs: No Transportation Needs  . Lack of Transportation (Medical): No  . Lack of Transportation (Non-Medical): No  Physical Activity:   . Days of Exercise per Week: Not on file  . Minutes of Exercise per Session: Not on file  Stress:   . Feeling of Stress : Not on file  Social Connections:   . Frequency of Communication with Friends and Family: Not on file  . Frequency of Social Gatherings with Friends and Family: Not on file  . Attends Religious Services: Not on file  . Active Member of Clubs or Organizations: Not on file  . Attends CArchivistMeetings: Not on file  . Marital Status: Not on file    Family History: Family History  Problem Relation Age of Onset  . Hypotension Mother   . Diverticulitis Father     Allergies: No Known Allergies  Medications Prior to Admission  Medication Sig Dispense Refill Last Dose  . AMBULATORY NON FORMULARY MEDICATION 1 Device by Other route once a week. Blood Pressure Cuff Medium Monitored Regularly at home ICD 10:Z34.90 1 kit 0   . Prenatal Vit-Fe Fumarate-FA (PRENATAL MULTIVITAMIN) TABS tablet  Take 1 tablet by mouth daily at 12 noon.     . Probiotic Product (PROBIOTIC-10 PO) Take by mouth.     . valACYclovir (VALTREX) 500 MG tablet Take 1 tablet (500 mg total) by mouth 2 (two) times daily. 60 tablet 1      Review of Systems  All systems reviewed and negative except as stated in HPI  Physical Exam Blood pressure 120/70, pulse 71, temperature 98.5 F (36.9 C), temperature source Oral, resp. rate 18, height _0  (1.6 m), weight 80.9 kg, last menstrual period 11/29/2018, unknown if currently breastfeeding. General appearance: alert, oriented, NAD Lungs: normal respiratory effort Heart: regular rate Abdomen: soft, non-tender; gravid Extremities: No calf swelling or tenderness Presentation: cephalic by RN SVE  Fetal monitoringBaseline: 115 bpm, Variability: Good {> 6 bpm), Accelerations:  Reactive and Decelerations: Early Uterine activityFrequency: Every 3 minutes  Dilation: 10 Effacement (%): 90 Station: Plus 2 Exam by:: Frances Maywood  Prenatal labs: ABO, Rh: --/--/B POS, B POS Performed at [facility], [place] [address] [place], [place] [address]  [phone]/25 1236) Antibody: NEG (12/25 1236) Rubella: 3.17 (06/16 1152) RPR: Non Reactive (10/13 0843)  HBsAg: Negative (06/16 1152)  HIV: Non Reactive (10/13 0843)  GC/Chlamydia: neg/neg 08/12/2019  GBS: Negative/-- (12/08 1622)  2-hr GTT: normal 06/17/2019 Genetic screening:  Low risk Panorama Anatomy US: normal  Prenatal Transfer Tool  Maternal Diabetes: No Genetic Screening: Normal Maternal Ultrasounds/Referrals: Normal Fetal Ultrasounds or other Referrals:  None Maternal Substance Abuse:  No Significant Maternal Medications:  None Significant Maternal Lab Results: Group B Strep negative  Results for orders placed or performed during the hospital encounter of 08/29/19 (from the past 24 hour(s))  Respiratory Panel by RT PCR (Flu A&B, Covid) - Nasopharyngeal Swab   Collection Time: 08/29/19 11:51 AM   Specimen: Nasopharyngeal Swab  Result Value Ref Range   SARS Coronavirus 2 by RT PCR NEGATIVE NEGATIVE   Influenza A by PCR NEGATIVE NEGATIVE   Influenza B by PCR NEGATIVE NEGATIVE  Type and screen [place]   Collection Time: 08/29/19 12:36 PM  Result Value Ref Range   ABO/RH(D) B POS    Antibody Screen NEG    Sample Expiration      09/01/2019,2359 Performed at [facility], [address], [place], [place] [address]   ABO/Rh   Collection Time: 08/29/19 12:36 PM  Result Value Ref Range   ABO/RH(D)      B POS Performed at [place] [address], [place], [place] [address]   CBC   Collection Time: 08/29/19 12:45 PM  Result Value Ref Range   WBC 12.6 (H) 4.0 - 10.5 K/uL   RBC 4.40 3.87 - 5.11 MIL/uL   Hemoglobin 13.6 12.0 - 15.0 g/dL   HCT 40.1 36.0 - 46.0 %   MCV  91.1 80.0 - 100.0 fL   MCH 30.9 26.0 - 34.0 pg   MCHC 33.9 30.0 - 36.0 g/dL   RDW 13.1 11.5 - 15.5 %   Platelets 309 150 - 400 K/uL   nRBC 0.0 0.0 - 0.2 %    Patient Active Problem List   Diagnosis Date Noted  . Normal labor and delivery 08/29/2019  . Marginal insertion of umbilical cord affecting management of mother 04/16/2019  . Genetic carrier status 03/17/2019  . Herpes genitalis in women   . Supervision of low-risk pregnancy 01/31/2019    Assessment: Kristy James is a 33 y.o. [id number] at 26w0dhere  for spontaneous labor.   #Labor: expectant management #Pain: IV pain meds PRN, epidural upon request #FWB: Cat I #GBS/ID: negative #COVID: swab pending #MOF: Breast #MOC: None #Circ: n/a  #Hx HSV: no lesions seen on exam, mother reports no symptoms, OK for vaginal delivery  Clarnce Flock 08/29/2019, 5:59 PM

## 2019-08-29 NOTE — Discharge Summary (Addendum)
Postpartum Discharge Summary    Patient Name: Kristy James DOB: 03/20/1986 MRN: [id number]  Date of admission: 08/29/2019 Delivering Provider: Clarnce Flock   Date of discharge: 08/30/2019  Admitting diagnosis: Normal labor and delivery [O80] Intrauterine pregnancy: [redacted]w[redacted]d    Secondary diagnosis:  Active Problems:   Supervision of low-risk pregnancy   Herpes genitalis in women   Genetic carrier status   Marginal insertion of umbilical cord affecting management of mother   Normal labor and delivery  Additional problems:      Discharge diagnosis: Term Pregnancy Delivered                                                                                                Post partum procedures:None  Augmentation: None  Complications: None  Hospital course:  Onset of Labor With Vaginal Delivery     33y.o. yo [id number] 384w0das admitted in Active Labor on 08/29/2019. Patient had an uncomplicated labor course as follows: arrived at 8 cm and progressed spontaneously to fully dilated.  Membrane Rupture Time/Date: 12:19 PM ,08/29/2019   Intrapartum Procedures: Episiotomy: None [1]                                         Lacerations:  None [1]  Patient had a delivery of a Viable infant. 08/29/2019  Information for the patient's newborn:  Kristy, [name][[id number]]Delivery Method: Vaginal, Spontaneous(Filed from Delivery Summary)     Pateint had an uncomplicated postpartum course.  She is ambulating, tolerating a regular diet, passing flatus, and urinating well. Patient is discharged home in stable condition on 08/30/19.  Delivery time: 12:29 PM    Magnesium Sulfate received: No BMZ received: No Rhophylac:N/A MMR:N/A Transfusion:No  Physical exam  Vitals:   08/29/19 1558 08/29/19 2030 08/30/19 0012 08/30/19 0533  BP: 120/70 119/72 117/70 101/71  Pulse: 71 77 66 70  Resp: 18 18 20 18   Temp: 98.5 F (36.9 C) 98.3 F (36.8 C) (!) 97.5 F (36.4 C) 98.3 F (36.8  C)  TempSrc: Oral Oral Oral   Weight:      Height:       General: alert, cooperative and no distress Lochia: appropriate Uterine Fundus: firm Incision: N/A DVT Evaluation: No evidence of DVT seen on physical exam. No significant calf/ankle edema. Labs: Lab Results  Component Value Date   WBC 12.6 (H) 08/29/2019   HGB 13.6 08/29/2019   HCT 40.1 08/29/2019   MCV 91.1 08/29/2019   PLT 309 08/29/2019   No flowsheet data found.  Discharge instruction: per After Visit Summary and "Baby and Me Booklet".  After visit meds:  Allergies as of 08/30/2019   No Known Allergies     Medication List    STOP taking these medications   valACYclovir 500 MG tablet Commonly known as: Valtrex     TAKE these medications   acetaminophen 325 MG tablet Commonly known as: Tylenol Take 2 tablets (650 mg total) by mouth every 4 (four) hours  as needed (for pain scale < 4).   AMBULATORY NON FORMULARY MEDICATION 1 Device by Other route once a week. Blood Pressure Cuff Medium Monitored Regularly at home ICD 10:Z34.90   ibuprofen 600 MG tablet Commonly known as: ADVIL Take 1 tablet (600 mg total) by mouth every 6 (six) hours.   polyethylene glycol powder 17 GM/SCOOP powder Commonly known as: GLYCOLAX/MIRALAX Take 255 g by mouth once for 1 dose.   prenatal multivitamin Tabs tablet Take 1 tablet by mouth daily at 12 noon.   PROBIOTIC-10 PO Take by mouth.       Diet: routine diet  Activity: Advance as tolerated. Pelvic rest for 6 weeks.   Outpatient follow up:6 weeks Follow up Appt:No future appointments. Follow up Visit:   Please schedule this patient for Postpartum visit in: 6 weeks with the following provider: Any provider For C/S patients schedule nurse incision check in weeks 2 weeks: no Low risk pregnancy complicated by: n/a Delivery mode:  SVD Anticipated Birth Control: Condoms PP Procedures needed: none  Schedule Integrated BH visit: no   Newborn Data: Live born  female  Birth Weight: 6 lb 2.6 oz (2795 g) APGAR: 8, 9  Newborn Delivery   Birth date/time: 08/29/2019 12:29:00 Delivery type: Vaginal, Spontaneous      Baby Feeding: Breast Disposition:home with mother   08/30/2019 Clarnce Flock, MD

## 2019-08-29 NOTE — Lactation Note (Signed)
This note was copied from a baby's chart. Lactation Consultation Note  Patient Name: Kristy James [id number] Date: 08/29/2019 Reason for consult: Initial assessment;Term  [phone] - 1645 - I conducted an initial consult with Kristy James. She states that her daughter, Kristy James, has latched 2 times since delivery (now 65 hours old), and that she felt baby was latching well thus far. She is an experienced breast feeding patient; she breast fed her previous two children 6 months and 9 months.  Kristy James was eating her dinner when I arrived in her room. Baby was sleeping int he bassinet. I conducted breast feeding basics including feeding frequency and duration, output expectations, and infant feeding patterns, and when to expect mature milk volume to increase. We discussed differences between colostrum and mature milk.  Kristy James states that she has positive breast changes in pregnancy. I noted that her nipples were everted and in tact. She was interested in a manual pump because she does not have a pump at home. I brought one to her with a size 27 flange, and I demonstrated how to clean her parts and set it up.  Kristy James is not currently enrolled in [facility], but she is interested in their services. She will be returning to work at some point, and she is interested in obtaining an electric pump, if eligible. I agreed to put in a [facility] referral.  I provided a breast feeding brochure. Kristy James states that she was taught how to hand express earlier, and she noted colostrum.   I encouraged her to call lactation as needed.   Maternal Data Formula Feeding for Exclusion: No Has patient been taught Hand Expression?: Yes Does the patient have breastfeeding experience prior to this delivery?: Yes  Feeding Feeding Type: Breast Fed  LATCH Score Latch: Grasps breast easily, tongue down, lips flanged, rhythmical sucking.  Audible Swallowing: Spontaneous and intermittent  Type of Nipple: Everted at rest  and after stimulation  Comfort (Breast/Nipple): Soft / non-tender  Hold (Positioning): No assistance needed to correctly position infant at breast.  LATCH Score: 10  Interventions Interventions: Breast feeding basics reviewed  Lactation Tools Discussed/Used Tools: Other (comment);Flanges(manual pump) Flange Size: 27 WIC Program: (interested in enrolling) Pump Review: Setup, frequency, and cleaning Initiated by:: hl Date initiated:: 08/29/19   Consult Status Consult Status: Follow-up Date: 08/30/19 Follow-up type: In-patient    Lenore Manner 08/29/2019, 4:52 PM

## 2019-08-29 NOTE — MAU Note (Signed)
contactions started at 0500, coming back to back.  Pt was on floor in lobby, assisted to rm.  Some bloody show.  Pants were damp, unsure if srom or sweat.  'low risk '.

## 2019-08-29 NOTE — Progress Notes (Signed)
MAU CN gave report to L&D CN.  Provider called and verbal orders place per protocol. Covid swab obtained.

## 2019-08-30 LAB — RPR: RPR Ser Ql: NONREACTIVE

## 2019-08-30 MED ORDER — OXYCODONE-ACETAMINOPHEN 5-325 MG PO TABS: 1.0000 | ORAL_TABLET | ORAL | 0 refills | Status: AC | PRN

## 2019-08-30 MED ORDER — ACETAMINOPHEN 325 MG PO TABS: 650.0000 mg | ORAL_TABLET | ORAL | 0 refills | Status: DC | PRN

## 2019-08-30 MED ORDER — IBUPROFEN 600 MG PO TABS: 600.0000 mg | ORAL_TABLET | Freq: Four times a day (QID) | ORAL | 0 refills | Status: DC

## 2019-08-30 MED ORDER — POLYETHYLENE GLYCOL 3350 17 GM/SCOOP PO POWD: 1.0000 | Freq: Once | ORAL | 0 refills | Status: AC

## 2019-08-30 NOTE — Plan of Care (Signed)
Patient appropriate for discharge.

## 2019-08-30 NOTE — Discharge Instructions (Signed)
Postpartum Care After Vaginal Delivery This sheet gives you information about how to care for yourself from the time you deliver your baby to up to 6-12 weeks after delivery (postpartum period). Your health care provider may also give you more specific instructions. If you have problems or questions, contact your health care provider. Follow these instructions at home: Vaginal bleeding  It is normal to have vaginal bleeding (lochia) after delivery. Wear a sanitary pad for vaginal bleeding and discharge. ? During the first week after delivery, the amount and appearance of lochia is often similar to a menstrual period. ? Over the next few weeks, it will gradually decrease to a dry, yellow-brown discharge. ? For most women, lochia stops completely by 4-6 weeks after delivery. Vaginal bleeding can vary from woman to woman.  Change your sanitary pads frequently. Watch for any changes in your flow, such as: ? A sudden increase in volume. ? A change in color. ? Large blood clots.  If you pass a blood clot from your vagina, save it and call your health care provider to discuss. Do not flush blood clots down the toilet before talking with your health care provider.  Do not use tampons or douches until your health care provider says this is safe.  If you are not breastfeeding, your period should return 6-8 weeks after delivery. If you are feeding your child breast milk only (exclusive breastfeeding), your period may not return until you stop breastfeeding. Perineal care  Keep the area between the vagina and the anus (perineum) clean and dry as told by your health care provider. Use medicated pads and pain-relieving sprays and creams as directed.  If you had a cut in the perineum (episiotomy) or a tear in the vagina, check the area for signs of infection until you are healed. Check for: ? More redness, swelling, or pain. ? Fluid or blood coming from the cut or tear. ? Warmth. ? Pus or a bad  smell.  You may be given a squirt bottle to use instead of wiping to clean the perineum area after you go to the bathroom. As you start healing, you may use the squirt bottle before wiping yourself. Make sure to wipe gently.  To relieve pain caused by an episiotomy, a tear in the vagina, or swollen veins in the anus (hemorrhoids), try taking a warm sitz bath 2-3 times a day. A sitz bath is a warm water bath that is taken while you are sitting down. The water should only come up to your hips and should cover your buttocks. Breast care  Within the first few days after delivery, your breasts may feel heavy, full, and uncomfortable (breast engorgement). Milk may also leak from your breasts. Your health care provider can suggest ways to help relieve the discomfort. Breast engorgement should go away within a few days.  If you are breastfeeding: ? Wear a bra that supports your breasts and fits you well. ? Keep your nipples clean and dry. Apply creams and ointments as told by your health care provider. ? You may need to use breast pads to absorb milk that leaks from your breasts. ? You may have uterine contractions every time you breastfeed for up to several weeks after delivery. Uterine contractions help your uterus return to its normal size. ? If you have any problems with breastfeeding, work with your health care provider or Science writer.  If you are not breastfeeding: ? Avoid touching your breasts a lot. Doing this can make  your breasts produce more milk. ? Wear a good-fitting bra and use cold packs to help with swelling. ? Do not squeeze out (express) milk. This causes you to make more milk. Intimacy and sexuality  Ask your health care provider when you can engage in sexual activity. This may depend on: ? Your risk of infection. ? How fast you are healing. ? Your comfort and desire to engage in sexual activity.  You are able to get pregnant after delivery, even if you have not had  your period. If desired, talk with your health care provider about methods of birth control (contraception). Medicines  Take over-the-counter and prescription medicines only as told by your health care provider.  If you were prescribed an antibiotic medicine, take it as told by your health care provider. Do not stop taking the antibiotic even if you start to feel better. Activity  Gradually return to your normal activities as told by your health care provider. Ask your health care provider what activities are safe for you.  Rest as much as possible. Try to rest or take a nap while your baby is sleeping. Eating and drinking   Drink enough fluid to keep your urine pale yellow.  Eat high-fiber foods every day. These may help prevent or relieve constipation. High-fiber foods include: ? Whole grain cereals and breads. ? Brown rice. ? Beans. ? Fresh fruits and vegetables.  Do not try to lose weight quickly by cutting back on calories.  Take your prenatal vitamins until your postpartum checkup or until your health care provider tells you it is okay to stop. Lifestyle  Do not use any products that contain nicotine or tobacco, such as cigarettes and e-cigarettes. If you need help quitting, ask your health care provider.  Do not drink alcohol, especially if you are breastfeeding. General instructions  Keep all follow-up visits for you and your baby as told by your health care provider. Most women visit their health care provider for a postpartum checkup within the first 3-6 weeks after delivery. Contact a health care provider if:  You feel unable to cope with the changes that your child brings to your life, and these feelings do not go away.  You feel unusually sad or worried.  Your breasts become red, painful, or hard.  You have a fever.  You have trouble holding urine or keeping urine from leaking.  You have little or no interest in activities you used to enjoy.  You have not  breastfed at all and you have not had a menstrual period for 12 weeks after delivery.  You have stopped breastfeeding and you have not had a menstrual period for 12 weeks after you stopped breastfeeding.  You have questions about caring for yourself or your baby.  You pass a blood clot from your vagina. Get help right away if:  You have chest pain.  You have difficulty breathing.  You have sudden, severe leg pain.  You have severe pain or cramping in your lower abdomen.  You bleed from your vagina so much that you fill more than one sanitary pad in one hour. Bleeding should not be heavier than your heaviest period.  You develop a severe headache.  You faint.  You have blurred vision or spots in your vision.  You have bad-smelling vaginal discharge.  You have thoughts about hurting yourself or your baby. If you ever feel like you may hurt yourself or others, or have thoughts about taking your own life, get help  right away. You can go to the nearest emergency department or call:  Your local emergency services (911 in the [place]  A suicide crisis helpline, such as the [place] at 272-684-5585. This is open 24 hours a day. Summary  The period of time right after you deliver your newborn up to 6-12 weeks after delivery is called the postpartum period.  Gradually return to your normal activities as told by your health care provider.  Keep all follow-up visits for you and your baby as told by your health care provider. This information is not intended to replace advice given to you by your health care provider. Make sure you discuss any questions you have with your health care provider. Document Released: 06/18/2007 Document Revised: 08/24/2017 Document Reviewed: 06/04/2017 Elsevier Patient Education  2020 [employer].

## 2019-08-30 NOTE — Lactation Note (Signed)
This note was copied from a baby's chart. Lactation Consultation Note  Patient Name: Kristy James [id number] Date: 08/30/2019 Reason for consult: Follow-up assessment;Primapara;1st time breastfeeding;Term;Infant weight loss;Other (Comment)(3% weight loss) Baby is 57 hours old  Mom and baby for early D/C.  Per mom the baby is feeding well and more swallows today.  Breast are feeling fuller , heavier and hearing more swallows. Nipples are feeling more sensitive, no breakdown per mom.  LC described transient sore nipples and encouraged mom prior to latch - moist heat few minutes, breast massage, hand express, pre- pump with hand pump and latch with compressions. Comfort gels x 6 days  and shells with instructions while awake.  Mom already has a hand pump with #24 F and #27 F .  Engorgement prevention and tx reviewed.  Mom aware of the [facility] resources after D/C and phone numbers.   Maternal Data    Feeding Feeding Type: (per mom baby last fed at 1110)  LATCH Score - Latch Score done by the [facility] )  Latch: Grasps breast easily, tongue down, lips flanged, rhythmical sucking.  Audible Swallowing: Spontaneous and intermittent  Type of Nipple: Everted at rest and after stimulation  Comfort (Breast/Nipple): Soft / non-tender  Hold (Positioning): No assistance needed to correctly position infant at breast.  LATCH Score: 10  Interventions Interventions: Breast feeding basics reviewed;Shells;Comfort gels;Hand pump  Lactation Tools Discussed/Used Tools: Shells;Pump;Flanges;Comfort gels Flange Size: 27 Shell Type: Inverted Breast pump type: Manual Pump Review: Setup, frequency, and cleaning;Milk Storage   Consult Status Consult Status: Complete Date: 08/30/19    [name] 08/30/2019, 12:27 PM

## 2019-09-01 ENCOUNTER — Encounter (HOSPITAL_COMMUNITY): Payer: Self-pay | Admitting: Family Medicine

## 2019-09-03 ENCOUNTER — Telehealth: Payer: Self-pay | Admitting: Obstetrics and Gynecology

## 2019-09-03 NOTE — Telephone Encounter (Signed)
Received a call from Kristy James about  pain in side and bleeding has increased. Offered her the next appointment available, but she did not want the appointment. She is requesting a call back from the clinical staff.

## 2019-09-03 NOTE — Telephone Encounter (Signed)
The patient stated she delievered her baby 5 days ago and she is experiencing severe pain in her sides that hinders movement. Her bleeding has also picked back up but no large blood clots but its red again. Please give a patient a call.

## 2019-09-04 NOTE — Telephone Encounter (Signed)
Called patient, no answer- left message stating we are trying to reach you to return your phone call, if you still need assistance please call us back. Also left message stating we are now closed through Monday if she were to need assistance.

## 2019-09-23 ENCOUNTER — Ambulatory Visit: Payer: Medicaid Other | Admitting: Family Medicine

## 2019-09-29 ENCOUNTER — Telehealth: Payer: Self-pay | Admitting: Family Medicine

## 2019-09-29 NOTE — Telephone Encounter (Signed)
The patient called in and asked her appointment was being scheduled as a virtual reason, as she feels she should come in. I informed the patient a nurse will contact her to discuss the reason for the decision of the visit being made virtual.

## 2019-09-29 NOTE — Telephone Encounter (Signed)
Called pt and confirmed with pt if she wanted to [facility] and if she had any lacerations after vaginal delivery.  Pt informed me that she thinks she had three stitches and she does not want BC.  Per chart review pt did not have a laceration.  I explained to the pt that due to increased cases of COVID if pt has had a normal delivery and does not desire contraception then the visit is viritual so that we maintain pt safety.  I advised pt that if she has any issues to please call the office and it may require her to come to the office.  Pt verbalized understanding.   Mel Almond, RN 09/29/19

## 2019-10-01 ENCOUNTER — Encounter: Payer: Medicaid Other | Admitting: Medical

## 2019-10-01 ENCOUNTER — Encounter: Payer: Self-pay | Admitting: Medical

## 2019-10-01 ENCOUNTER — Other Ambulatory Visit: Payer: Self-pay

## 2019-10-01 NOTE — Progress Notes (Signed)
Called patient for appt and she states she told the nurse she wanted it canceled yesterday. Patient does not wish to reschedule when offered.

## 2019-10-08 ENCOUNTER — Ambulatory Visit: Payer: Medicaid Other | Admitting: Advanced Practice Midwife

## 2021-03-11 IMAGING — US US MFM OB DETAIL +14 WK
1 series · 13 of 28 positions shown · non-contrast
Comparison: none

[Series 1: us mfm ob detail +14 wk · 106 acquisitions, 13 frames shown]
[im 4/106]
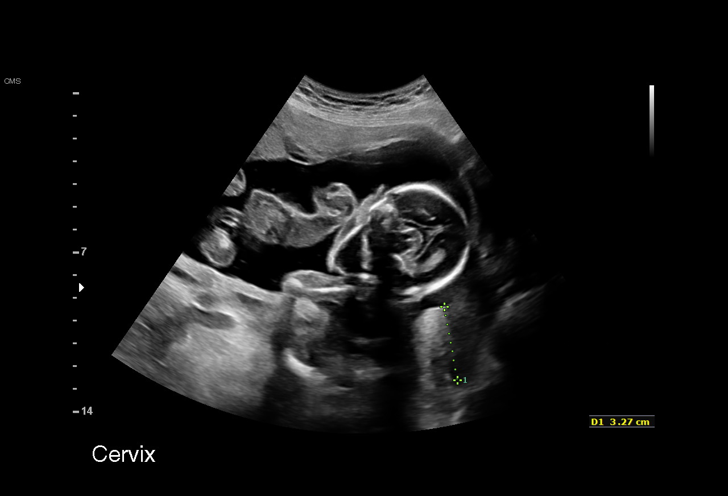
[im 12/106]
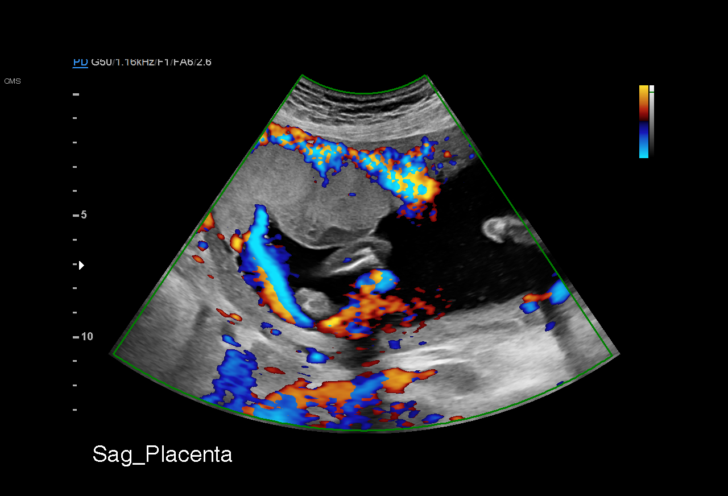
[im 20/106]
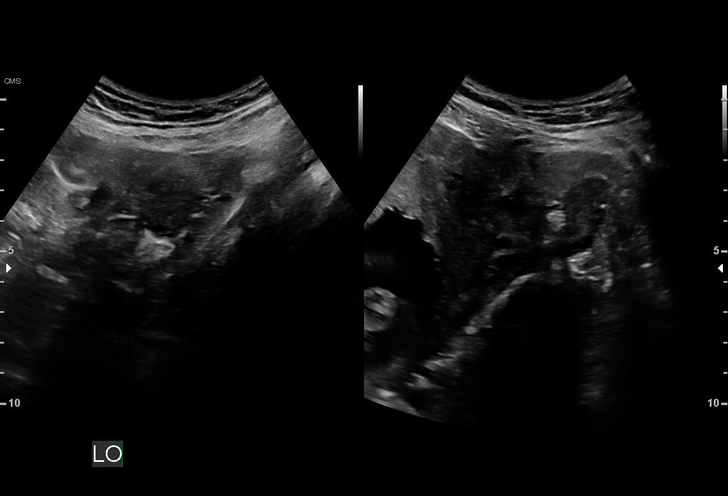
[im 28/106]
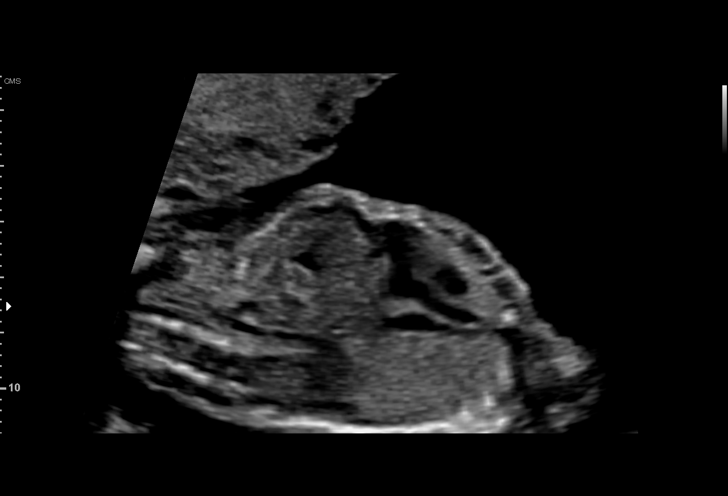
[im 36/106]
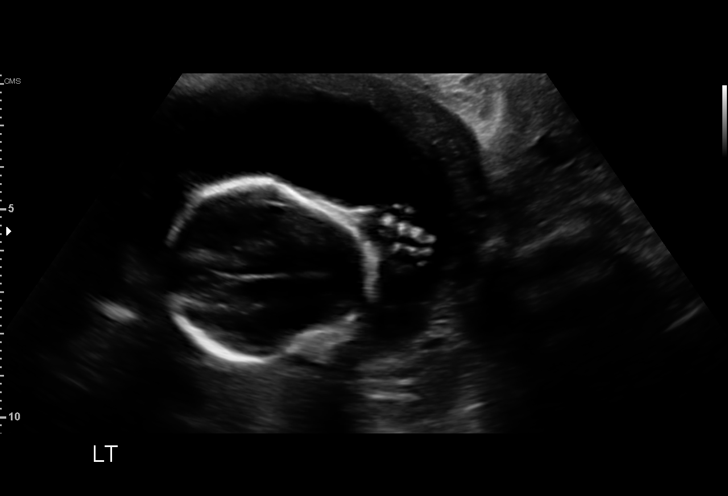
[im 43/106]
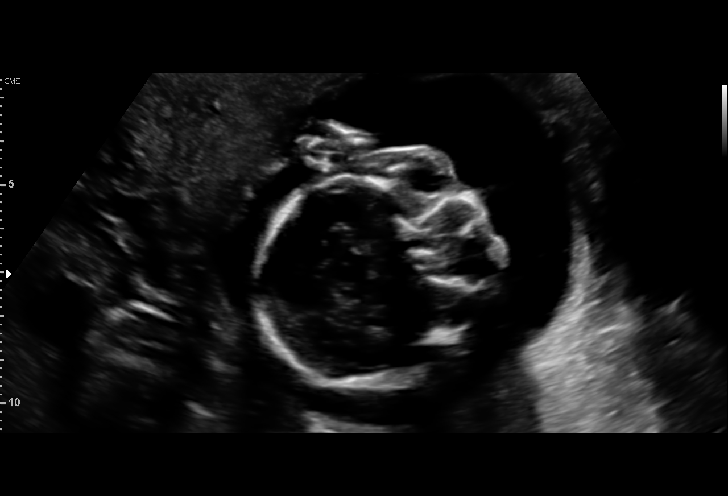
[im 55/106]
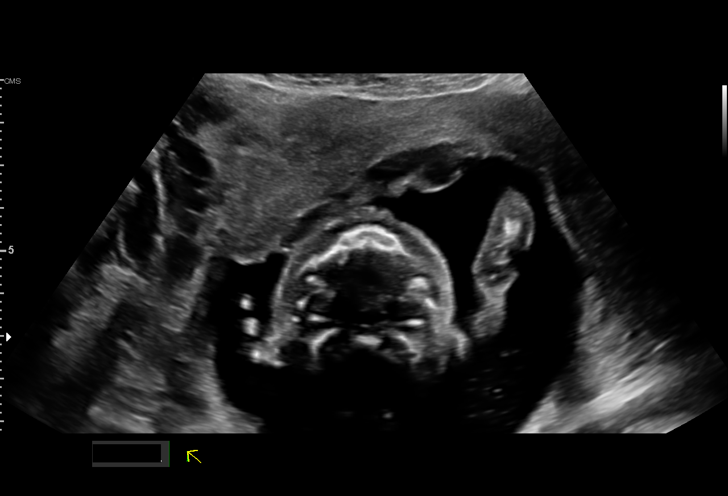
[im 63/106]
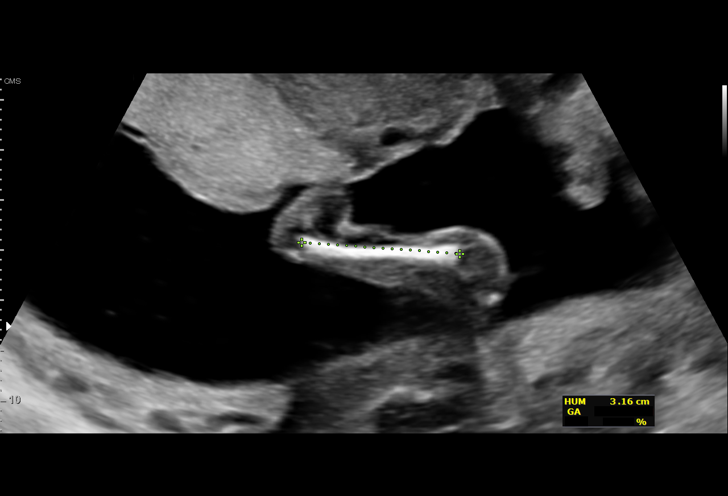
[im 71/106]
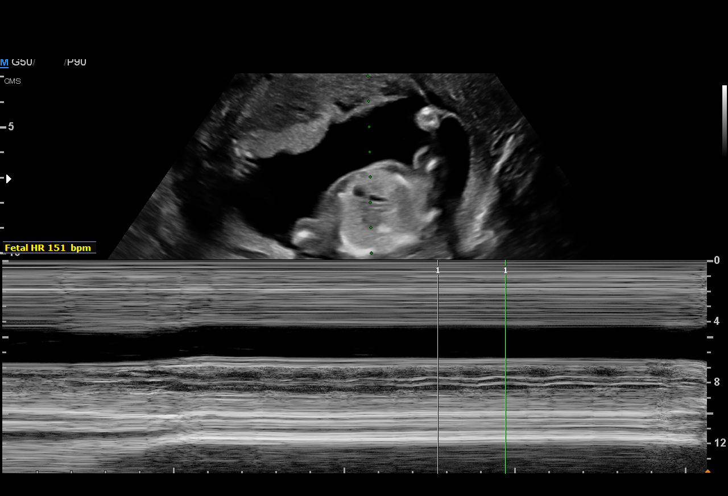
[im 78/106]
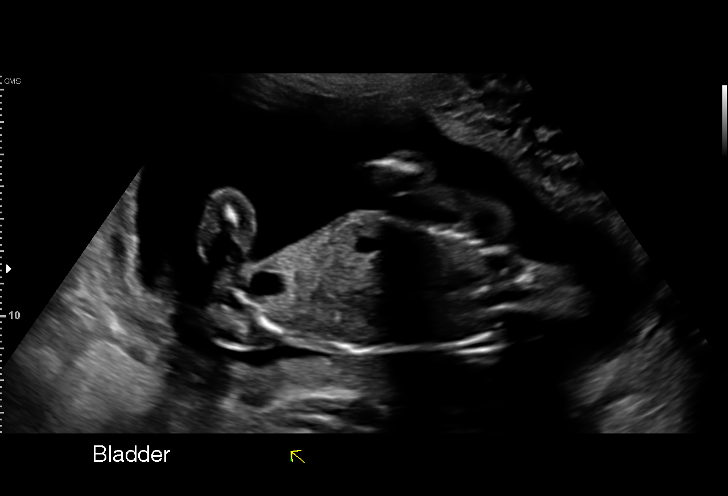
[im 86/106]
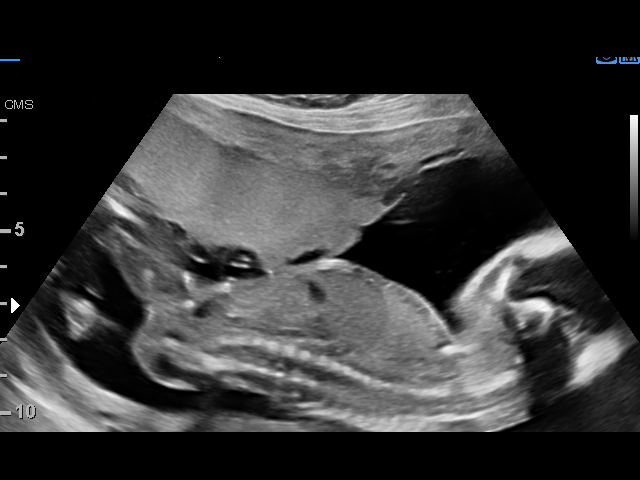
[im 94/106]
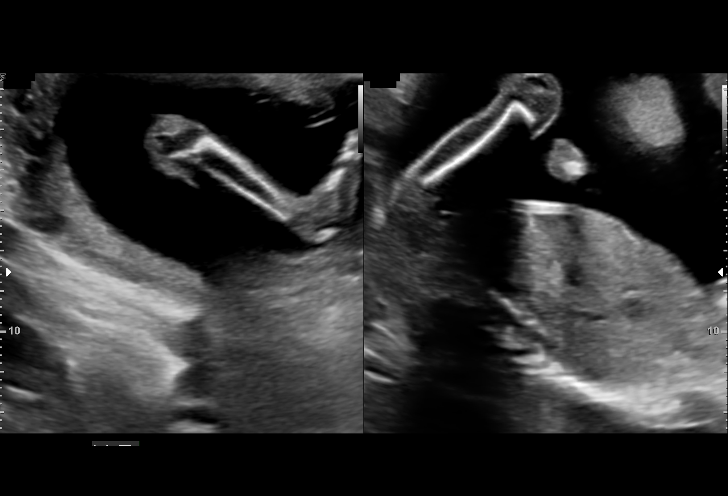
[im 102/106]
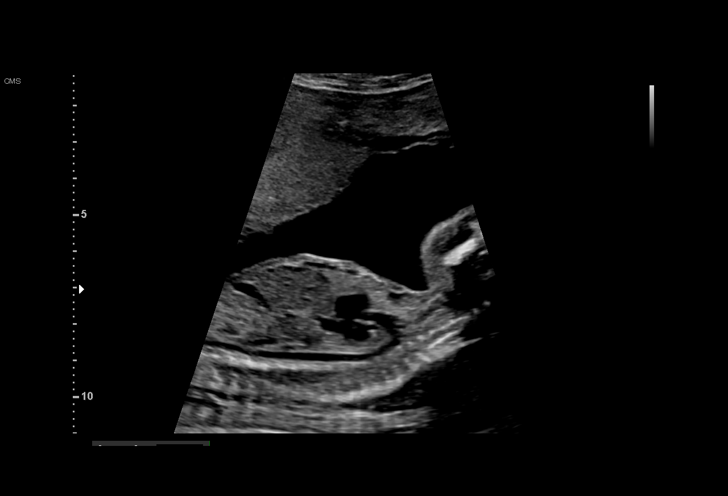

[13 of 28 positions shown; findings below may reference images not displayed]

[address]

 ----------------------------------------------------------------------

 ----------------------------------------------------------------------
Indications

  Antenatal screening for malformations
  Genetic carrier (SMA) - FOB not a carrier
  Uterine fibroids affecting pregnancy in        O34.12,
  second trimester, antepartum
  19 weeks gestation of pregnancy
 ----------------------------------------------------------------------
Vital Signs

                                                Height:        5'4"
Fetal Evaluation

 Num Of Fetuses:         1
 Fetal Heart Rate(bpm):  151
 Cardiac Activity:       Observed
 Presentation:           Cephalic
 Placenta:               Fundal
 P. Cord Insertion:      Not well visualized

 Amniotic Fluid
 AFI FV:      Within normal limits

                             Largest Pocket(cm)

Biometry

 BPD:      47.6  mm     G. Age:  20w 3d         86  %    CI:        75.36   %    70 - 86
                                                         FL/HC:      17.9   %    16.1 -
 HC:      173.9  mm     G. Age:  19w 6d         67  %    HC/AC:      1.27        1.09 -
 AC:      136.4  mm     G. Age:  19w 1d         33  %    FL/BPD:     65.5   %
 FL:       31.2  mm     G. Age:  19w 5d         52  %    FL/AC:      22.9   %    20 - 24
 HUM:      31.2  mm     G. Age:  20w 3d         76  %
 CER:      19.9  mm     G. Age:  19w 0d         41  %
 NFT:       1.4  mm
 CM:        5.6  mm

 Est. FW:     293  gm    0 lb 10 oz      46  %
OB History

 Gravidity:    4         Term:   2         SAB:   1
Gestational Age

 LMP:           19w 3d        Date:  11/29/18                 EDD:   09/05/19
 U/S Today:     19w 6d                                        EDD:   09/02/19
 Best:          19w 3d     Det. By:  LMP  (11/29/18)          EDD:   09/05/19
Anatomy

 Cranium:               Appears normal         LVOT:                   Appears normal
 Cavum:                 Appears normal         Aortic Arch:            Appears normal
 Ventricles:            Appears normal         Ductal Arch:            Appears normal
 Choroid Plexus:        Appears normal         Diaphragm:              Appears normal
 Cerebellum:            Appears normal         Stomach:                Appears normal, left
                                                                       sided
 Posterior Fossa:       Appears normal         Abdomen:                Appears normal
 Nuchal Fold:           Appears normal         Abdominal Wall:         Appears nml (cord
                                                                       insert, abd wall)
 Face:                  Appears normal         Cord Vessels:           Appears normal (3
                        (orbits and profile)                           vessel cord)
 Lips:                  Appears normal         Kidneys:                Appear normal
 Palate:                Appears normal         Bladder:                Appears normal
 Thoracic:              Appears normal         Spine:                  Appears normal
 Heart:                 Appears normal         Upper Extremities:      Appears normal
                        (4CH, axis, and
                        situs)
 RVOT:                  Appears normal         Lower Extremities:      Appears normal

 Other:  Heels/feet and hands/5th digits visualized. Nasal bone visualized.
Cervix Uterus Adnexa

 Cervix
 Length:            3.3  cm.
 Normal appearance by transabdominal scan.

 Uterus
 Single fibroid noted, see table below.

 Left Ovary
 Within normal limits.

 Right Ovary
 Within normal limits.

 Cul De Sac
 No free fluid seen.

 Adnexa
 No abnormality visualized.
Myomas

  Site                     L(cm)      W(cm)      D(cm)      Location
  Anterior Left
 ----------------------------------------------------------------------

  Blood Flow                 RI        PI       Comments

 ----------------------------------------------------------------------
Impression

 We performed fetal anatomy scan. No makers of
 aneuploidies or fetal structural defects are seen. Fetal
 biometry is consistent with her previously-established dates.
 Amniotic fluid is normal and good fetal activity is seen.
 Patient understands the limitations of ultrasound in detecting
 fetal anomalies.
 A small anterior intramural myoma is seen (measurements
 above).

 On cell-free fetal DNA screening, the risks of fetal
 aneuploidies are not increased.
 I called the patient after she left and reassured her of the
 findings.
Recommendations

 -An appointment was made for her to return in 4 weeks for
 completion of fetal anatomy (placental cord insertion to rule
 out marginal insertion).
                 Halder, Marquetta

## 2022-04-10 ENCOUNTER — Ambulatory Visit
Admission: EM | Admit: 2022-04-10 | Discharge: 2022-04-10 | Disposition: A | Discharge status: Home / Self Care | Payer: Self-pay | Attending: Family Medicine | Admitting: Family Medicine

## 2022-04-10 ENCOUNTER — Encounter: Payer: Self-pay | Admitting: Medical

## 2022-04-10 DIAGNOSIS — Z3201 Encounter for pregnancy test, result positive: Secondary | ICD-10-CM | POA: Diagnosis not present

## 2022-04-10 LAB — POCT URINE PREGNANCY: Preg Test, Ur: POSITIVE — AB

## 2022-04-10 NOTE — ED Triage Notes (Signed)
The patient is requesting a pregnancy test, LMP: 02/24/22, she states she has been nauseous and sleepy.

## 2022-04-10 NOTE — Discharge Instructions (Addendum)
Your pregnancy test was positive today. You are approximately [redacted] weeks pregnant

## 2022-04-10 NOTE — ED Provider Notes (Addendum)
[facility]    CSN: [id number] Arrival date & time: 04/10/22  0935      History   Chief Complaint Chief Complaint  Patient presents with   Possible Pregnancy    HPI Kristy James is a 36 y.o. female.    Possible Pregnancy   Here for delayed menses.  Last menstrual cycle was June 23.  In the last 2 weeks or so she has had nausea and has felt tired.   No fever or chills or spotting or abdominal pain currently  History reviewed. No pertinent past medical history.  There are no problems to display for this patient.     OB History   No obstetric history on file.      Home Medications    Prior to Admission medications   Not on File    Family History History reviewed. No pertinent family history.  Social History     Allergies   Patient has no allergy information on record.   Review of Systems Review of Systems   Physical Exam Triage Vital Signs ED Triage Vitals  Enc Vitals Group     BP 04/10/22 0956 116/77     Pulse Rate 04/10/22 0956 69     Resp 04/10/22 0956 16     Temp 04/10/22 0956 98.7 F (37.1 C)     Temp Source 04/10/22 0956 Oral     SpO2 04/10/22 0956 99 %     Weight --      Height --      Head Circumference --      Peak Flow --      Pain Score 04/10/22 0955 0     Pain Loc --      Pain Edu? --      Excl. in [place]? --    No data found.  Updated Vital Signs BP 116/77 (BP Location: Right Arm)   Pulse 69   Temp 98.7 F (37.1 C) (Oral)   Resp 16   LMP 02/24/2022   SpO2 99%   Visual Acuity Right Eye Distance:   Left Eye Distance:   Bilateral Distance:    Right Eye Near:   Left Eye Near:    Bilateral Near:     Physical Exam Vitals reviewed.  Constitutional:      General: She is not in acute distress.    Appearance: She is not toxic-appearing.  HENT:     Mouth/Throat:     Mouth: Mucous membranes are moist.  Eyes:     Extraocular Movements: Extraocular movements intact.     Pupils: Pupils are equal, round,  and reactive to light.  Cardiovascular:     Rate and Rhythm: Normal rate and regular rhythm.     Heart sounds: No murmur heard. Pulmonary:     Effort: Pulmonary effort is normal.     Breath sounds: Normal breath sounds.  Skin:    Coloration: Skin is not jaundiced or pale.  Neurological:     Mental Status: She is alert and oriented to person, place, and time.  Psychiatric:        Behavior: Behavior normal.      UC Treatments / Results  Labs (all labs ordered are listed, but only abnormal results are displayed) Labs Reviewed  POCT URINE PREGNANCY    EKG   Radiology No results found.  Procedures Procedures (including critical care time)  Medications Ordered in UC Medications - No data to display  Initial Impression / Assessment and Plan /  UC Course  I have reviewed the triage vital signs and the nursing notes.  Pertinent labs & imaging results that were available during my care of the patient were reviewed by me and considered in my medical decision making (see chart for details).     UPT is positive, making her about [redacted] weeks pregnant.  She plans to go back to her OB/GYN where she received care for her pregnancy 3 years ago.  We discussed antinausea measures Final Clinical Impressions(s) / UC Diagnoses   Final diagnoses:  Positive pregnancy test     Discharge Instructions      Your pregnancy test was positive today. You are approximately [redacted] weeks pregnant        ED Prescriptions   None    PDMP not reviewed this encounter.   Barrett Henle, MD 04/10/22 1034    Barrett Henle, MD 04/10/22 1044

## 2022-05-23 ENCOUNTER — Other Ambulatory Visit: Payer: Self-pay

## 2022-05-23 ENCOUNTER — Other Ambulatory Visit (HOSPITAL_COMMUNITY)
Admission: RE | Admit: 2022-05-23 | Discharge: 2022-05-23 | Disposition: A | Discharge status: Home / Self Care | Payer: Medicaid Other | Source: Ambulatory Visit | Attending: Family Medicine | Admitting: Family Medicine

## 2022-05-23 ENCOUNTER — Ambulatory Visit (INDEPENDENT_AMBULATORY_CARE_PROVIDER_SITE_OTHER): Payer: Medicaid Other

## 2022-05-23 DIAGNOSIS — Z3480 Encounter for supervision of other normal pregnancy, unspecified trimester: Secondary | ICD-10-CM | POA: Diagnosis present

## 2022-05-23 NOTE — Progress Notes (Signed)
New OB Intake  I connected with  Kristy James on 05/23/22 at 10:15 AM EDT by In Person Visit and verified that I am speaking with the correct person using two identifiers. Nurse is located at [facility] and pt is located at [facility].  I discussed the limitations, risks, security and privacy concerns of performing an evaluation and management service by telephone and the availability of in person appointments. I also discussed with the patient that there may be a patient responsible charge related to this service. The patient expressed understanding and agreed to proceed.  I explained I am completing New OB Intake today. We discussed her EDD of 11/30/22 that is based on LMP of 02/23/22. Pt is G5/P3. I reviewed her allergies, medications, Medical/Surgical/OB history, and appropriate screenings. I informed her of [facility] services. [facility] information placed in AVS. Based on history, this is a/an  pregnancy uncomplicated .   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 Patient given information for [facility] Healthy Baby website for more information about [facility] and [place]. Patient is not interested in water birth. Offered upcoming OB visit with CNM to discuss further.  MyChart/Babyscripts MyChart access verified. I explained pt will have some visits in office and some virtually. Babyscripts instructions given and order placed. Patient verifies receipt of registration text/e-mail. Account successfully created and app downloaded.  Blood Pressure Cuff/Weight Scale  Pt has her own BP Cuff , Explained after first prenatal appt pt will check weekly and document in 42. Patient does / does  not  have weight scale. Weight scale ordered for patient to pick up from [employer].   Anatomy US Explained first scheduled [place] will be around 19 weeks. Anatomy US scheduled for 14/19/23 at 0945a. Pt notified to arrive at 0930a.  Labs Discussed Johnsie Cancel genetic screening with patient. Would like both Panorama and Horizon drawn at new OB visit. Routine prenatal labs needed.  Covid Vaccine Patient has covid vaccine.   Is patient a CenteringPregnancy candidate?  Accepted Declined due to NA Not a candidate due to NA Centering Patient" indicated on sticky note   Is patient a Mom+Baby Combined Care candidate?  Not a candidate    Scheduled with Mom+Baby provider   Social Determinants of Health Food Insecurity: Patient denies food insecurity. WIC Referral: Patient is interested in referral to [facility]  Transportation: Patient denies transportation needs. Childcare: Discussed no children allowed at ultrasound appointments. Offered childcare services; patient declines childcare services at this time.  First visit review I reviewed new OB appt with pt. I explained she will have a provider visit that includes . Explained pt will be seen by Dr. Ernestina Patches at first visit; encounter routed to appropriate provider. Explained that patient will be seen by pregnancy navigator following visit with provider.   Bethanne Ginger, [place] 05/23/2022  10:29 AM

## 2022-05-24 LAB — CBC/D/PLT+RPR+RH+ABO+RUBIGG...
Antibody Screen: NEGATIVE
Basophils Absolute: 0 10*3/uL (ref 0.0–0.2)
Basos: 0 %
EOS (ABSOLUTE): 0.2 10*3/uL (ref 0.0–0.4)
Eos: 2 %
HCV Ab: NONREACTIVE
HIV Screen 4th Generation wRfx: NONREACTIVE
Hematocrit: 39.3 % (ref 34.0–46.6)
Hemoglobin: 13.5 g/dL (ref 11.1–15.9)
Hepatitis B Surface Ag: NEGATIVE
Immature Grans (Abs): 0 10*3/uL (ref 0.0–0.1)
Immature Granulocytes: 0 %
Lymphocytes Absolute: 1.6 10*3/uL (ref 0.7–3.1)
Lymphs: 18 %
MCH: 30.3 pg (ref 26.6–33.0)
MCHC: 34.4 g/dL (ref 31.5–35.7)
MCV: 88 fL (ref 79–97)
Monocytes Absolute: 0.5 10*3/uL (ref 0.1–0.9)
Monocytes: 5 %
Neutrophils Absolute: 6.7 10*3/uL (ref 1.4–7.0)
Neutrophils: 75 %
Platelets: 324 10*3/uL (ref 150–450)
RBC: 4.45 x10E6/uL (ref 3.77–5.28)
RDW: 12.4 % (ref 11.7–15.4)
RPR Ser Ql: NONREACTIVE
Rh Factor: POSITIVE
Rubella Antibodies, IGG: 2.67 index (ref >0.99)
WBC: 9.1 10*3/uL (ref 3.4–10.8)

## 2022-05-24 LAB — HEMOGLOBIN A1C
Est. average glucose Bld gHb Est-mCnc: 103 mg/dL
Hgb A1c MFr Bld: 5.2 % (ref 4.8–5.6)

## 2022-05-24 LAB — HCV INTERPRETATION

## 2022-05-25 LAB — GC/CHLAMYDIA PROBE AMP (~~LOC~~) NOT AT ARMC
Chlamydia: NEGATIVE
Comment: NEGATIVE
Comment: NORMAL
Neisseria Gonorrhea: NEGATIVE

## 2022-05-25 LAB — URINE CULTURE, OB REFLEX

## 2022-05-25 LAB — CULTURE, OB URINE

## 2022-05-28 LAB — PANORAMA PRENATAL TEST FULL PANEL:PANORAMA TEST PLUS 5 ADDITIONAL MICRODELETIONS: FETAL FRACTION: 11.5

## 2022-06-05 ENCOUNTER — Ambulatory Visit (INDEPENDENT_AMBULATORY_CARE_PROVIDER_SITE_OTHER): Payer: Medicaid Other | Admitting: Family Medicine

## 2022-06-05 ENCOUNTER — Other Ambulatory Visit: Payer: Self-pay

## 2022-06-05 ENCOUNTER — Encounter: Payer: Self-pay | Admitting: Family Medicine

## 2022-06-05 ENCOUNTER — Other Ambulatory Visit (HOSPITAL_COMMUNITY)
Admission: RE | Admit: 2022-06-05 | Discharge: 2022-06-05 | Disposition: A | Discharge status: Home / Self Care | Payer: Medicaid Other | Source: Ambulatory Visit | Attending: Family Medicine | Admitting: Family Medicine

## 2022-06-05 VITALS — BP 115/71 | HR 66 | Wt 158.0 lb

## 2022-06-05 DIAGNOSIS — Z148 Genetic carrier of other disease: Secondary | ICD-10-CM

## 2022-06-05 DIAGNOSIS — Z124 Encounter for screening for malignant neoplasm of cervix: Secondary | ICD-10-CM | POA: Diagnosis present

## 2022-06-05 DIAGNOSIS — Z3480 Encounter for supervision of other normal pregnancy, unspecified trimester: Secondary | ICD-10-CM | POA: Diagnosis present

## 2022-06-05 DIAGNOSIS — A6009 Herpesviral infection of other urogenital tract: Secondary | ICD-10-CM

## 2022-06-05 NOTE — Progress Notes (Signed)
INITIAL PRENATAL VISIT  Subjective:   Kristy James is being seen today for her first obstetrical visit.  This is a planned pregnancy. This is a desired pregnancy.  She is at 99w4dgestation by LMP. Her obstetrical history is significant for  marginal cord in last pregnancy . Relationship with FOB: spouse, living together. Patient does intend to breast feed. Pregnancy history fully reviewed.  Patient reports no complaints.  Indications for ASA therapy (per uptodate) One of the following: Previous pregnancy with preeclampsia, especially early onset and with an adverse outcome No Multifetal gestation No Chronic hypertension No Type 1 or 2 diabetes mellitus No Chronic kidney disease No Autoimmune disease (antiphospholipid syndrome, systemic lupus erythematosus) No  Two or more of the following: Nulliparity No Obesity (body mass index >30 kg/m2) No Family history of preeclampsia in mother or sister No Age ?35 years No Sociodemographic characteristics (African American race, low socioeconomic level) No Personal risk factors (eg, previous pregnancy with low birth weight or small for gestational age infant, previous adverse pregnancy outcome [eg, stillbirth], interval >10 years between pregnancies) No  Indications for early GDM screening  First-degree relative with diabetes No BMI >30kg/m2 No Age > 25 No Previous birth of an infant weighing ?4000 g No Gestational diabetes mellitus in a previous pregnancy No Glycated hemoglobin ?5.7 percent (39 mmol/mol), impaired glucose tolerance, or impaired fasting glucose on previous testing No High-risk race/ethnicity (eg, African American, Latino, Native American, Asian American, Pacific Islander) No Previous stillbirth of unknown cause No Maternal birthweight > 9 lbs No History of cardiovascular disease No Hypertension or on therapy for hypertension No High-density lipoprotein cholesterol level <35 mg/dL (0.90 mmol/L) and/or a triglyceride  level >250 mg/dL (2.82 mmol/L) No Polycystic ovary syndrome No Physical inactivity No Other clinical condition associated with insulin resistance (eg, severe obesity, acanthosis nigricans) No Current use of glucocorticoids No   Early screening tests: FBS, A1C, Random CBG, glucose challenge   Review of Systems:   Review of Systems  Objective:    Obstetric History OB History  Gravida Para Term Preterm AB Living  _0 0 1 3  SAB IAB Ectopic Multiple Live Births  1 0 0   3    # Outcome Date GA Lbr Len/2nd Weight Sex Delivery Anes PTL Lv  5 Current           4 Term 08/29/19 373w0d7:00 / 00:29 6 lb 2.6 oz (2.795 kg) F Vag-Spont None  LIV  3 SAB 2017          2 Term 2012 4074w0d lb 10 oz (3.459 kg) M Vag-Spont EPI  LIV     Birth Comments: none  1 Term 2008 [id number] 11 oz (3.487 kg) F Vag-Spont EPI  LIV     Birth Comments: none    Past Medical History:  Diagnosis Date   Herpes genitalis in women    Medical history non-contributory     Past Surgical History:  Procedure Laterality Date   NO PAST SURGERIES      Current Outpatient Medications on File Prior to Visit  Medication Sig Dispense Refill   acetaminophen (TYLENOL) 325 MG tablet Take 2 tablets (650 mg total) by mouth every 4 (four) hours as needed (for pain scale < 4). 30 tablet 0   Prenatal Vit-Fe Fumarate-FA (PRENATAL MULTIVITAMIN) TABS tablet Take 1 tablet by mouth daily at 12 noon.     AMBULATORY NON FORMULARY MEDICATION 1 Device by Other route once  a week. Blood Pressure Cuff Medium Monitored Regularly at home ICD 10:Z34.90 1 kit 0   ibuprofen (ADVIL) 600 MG tablet Take 1 tablet (600 mg total) by mouth every 6 (six) hours. (Patient not taking: Reported on 05/23/2022) 30 tablet 0   Probiotic Product (PROBIOTIC-10 PO) Take by mouth. (Patient not taking: Reported on 05/23/2022)     No current facility-administered medications on file prior to visit.    No Known Allergies  Social History:  reports that she  has quit smoking. Her smoking use included cigarettes. She has never been exposed to tobacco smoke. She has never used smokeless tobacco. She reports that she does not currently use alcohol. She reports that she does not use drugs.  Family History  Problem Relation Age of Onset   Hypotension Mother    Diverticulitis Father     The following portions of the patient's history were reviewed and updated as appropriate: allergies, current medications, past family history, past medical history, past social history, past surgical history and problem list.  Review of Systems Review of Systems    Physical Exam:  BP 115/71   Pulse 66   Wt 158 lb (71.7 kg)   LMP 02/23/2022   BMI 27.99 kg/m  CONSTITUTIONAL: Well-developed, well-nourished female in no acute distress.  HENT:  Normocephalic, atraumatic, External right and left ear normal. Oropharynx is clear and moist EYES: Conjunctivae normal. No scleral icterus.  NECK: Normal range of motion, supple, no masses.  Normal thyroid.  SKIN: Skin is warm and dry. No rash noted. Not diaphoretic. No erythema. No pallor. MUSCULOSKELETAL: Normal range of motion. No tenderness.  No cyanosis, clubbing, or edema.   NEUROLOGIC: Alert and oriented to person, place, and time. Normal muscle tone coordination.  PSYCHIATRIC: Normal mood and affect. Normal behavior. Normal judgment and thought content. CARDIOVASCULAR: Normal heart rate noted, regular rhythm RESPIRATORY: Clear to auscultation bilaterally. Effort and breath sounds normal, no problems with respiration noted. BREASTS: Symmetric in size. No masses, skin changes, nipple drainage, or lymphadenopathy. ABDOMEN: Soft, normal bowel sounds, no distention noted.  No tenderness, rebound or guarding. Fundal ht: pelvic rim PELVIC: Normal appearing external genitalia; normal appearing vaginal mucosa and cervix.  No abnormal discharge noted.  Pap smear obtained.  Normal uterine size, no other palpable masses, no  uterine or adnexal tenderness. FHR: 155   Assessment:    Pregnancy: [id number]   1. Supervision of other normal pregnancy, antepartum - Cytology - PAP( [place]) Confirmed plan for Centering Pregnancy Reviewed that she can get a copy of her appts at the desk today Noted 10/9 group start  2. Carrier of spinal muscular atrophy - Same partner as 2020 pregnancy when she was tested.   3. Herpes genitalis in women PPX at 36 wks discussed  4. Cervical cancer screening - Cytology - PAP( [place])     Plan:   Initial labs drawn at [facility] intake Prenatal vitamins. Problem list reviewed and updated. Reviewed in detail the nature of the practice with collaborative care between  Genetic screening discussed: NIPS LR, female. AFP next visit Role of ultrasound in pregnancy discussed; Anatomy US: ordered. Amniocentesis discussed: not indicated. Follow up in 1 weeks. Discussed clinic routines, schedule of care and testing, genetic screening options, involvement of students and residents under the direct supervision of APPs and doctors and presence of female providers. Pt verbalized understanding.  Future Appointments  Date Time Provider [place]  06/12/2022  9:00 AM CENTERING PROVIDER [facility]  07/06/2022  9:30  AM WMC-MFC NURSE [facility]  07/06/2022  9:45 AM WMC-MFC US4 [facility]MC[facility]US [facility]  07/10/2022  9:00 AM CENTERING PROVIDER [facility]  08/07/2022  9:00 AM CENTERING PROVIDER [facility]  08/21/2022  9:00 AM CENTERING PROVIDER [facility]  09/05/2022  9:00 AM CENTERING PROVIDER [facility]-CWH [facility]  09/18/2022  9:00 AM CENTERING PROVIDER [facility]  10/02/2022  9:00 AM CENTERING PROVIDER WMC[facility]  10/16/2022  9:00 AM CENTERING PROVIDER [facility]  10/30/2022  9:00 AM CENTERING PROVIDER WMC-CWH [place]     Caren Macadam, MD 06/05/2022 10:21 AM

## 2022-06-07 LAB — HORIZON CUSTOM: REPORT SUMMARY: POSITIVE — AB

## 2022-06-07 LAB — CYTOLOGY - PAP
Comment: NEGATIVE
Diagnosis: NEGATIVE
High risk HPV: NEGATIVE

## 2022-06-12 ENCOUNTER — Encounter: Payer: Medicaid Other | Admitting: Advanced Practice Midwife

## 2022-06-12 ENCOUNTER — Encounter: Payer: Self-pay | Admitting: Advanced Practice Midwife

## 2022-06-14 ENCOUNTER — Telehealth: Payer: Self-pay | Admitting: *Deleted

## 2022-06-14 ENCOUNTER — Encounter: Payer: Self-pay | Admitting: *Deleted

## 2022-06-14 NOTE — Telephone Encounter (Signed)
Received [name] showing she is at increased carrier risk for SMA. I called patient and heard message " voicemail is full and cannot accept any messages.". I will send a detailed MyChart message. [name]

## 2022-07-06 ENCOUNTER — Ambulatory Visit: Payer: Medicaid Other | Attending: Family Medicine

## 2022-07-06 ENCOUNTER — Other Ambulatory Visit: Payer: Medicaid Other

## 2022-07-06 ENCOUNTER — Other Ambulatory Visit: Payer: Self-pay | Admitting: *Deleted

## 2022-07-06 ENCOUNTER — Ambulatory Visit: Payer: Medicaid Other | Admitting: *Deleted

## 2022-07-06 ENCOUNTER — Encounter: Payer: Self-pay | Admitting: *Deleted

## 2022-07-06 VITALS — BP 126/64 | HR 80

## 2022-07-06 DIAGNOSIS — Z3A19 19 weeks gestation of pregnancy: Secondary | ICD-10-CM | POA: Insufficient documentation

## 2022-07-06 DIAGNOSIS — Z3480 Encounter for supervision of other normal pregnancy, unspecified trimester: Secondary | ICD-10-CM | POA: Diagnosis not present

## 2022-07-06 DIAGNOSIS — Z362 Encounter for other antenatal screening follow-up: Secondary | ICD-10-CM

## 2022-07-06 DIAGNOSIS — Z148 Genetic carrier of other disease: Secondary | ICD-10-CM | POA: Diagnosis not present

## 2022-07-06 DIAGNOSIS — Z363 Encounter for antenatal screening for malformations: Secondary | ICD-10-CM | POA: Insufficient documentation

## 2022-07-06 DIAGNOSIS — Z3689 Encounter for other specified antenatal screening: Secondary | ICD-10-CM | POA: Insufficient documentation

## 2022-07-06 DIAGNOSIS — O358XX0 Maternal care for other (suspected) fetal abnormality and damage, not applicable or unspecified: Secondary | ICD-10-CM | POA: Diagnosis not present

## 2022-07-06 NOTE — Progress Notes (Signed)
Opened in error

## 2022-07-08 LAB — AFP, SERUM, OPEN SPINA BIFIDA
AFP MoM: 1.4
AFP Value: 73.3 ng/mL
Gest. Age on Collection Date: 19 weeks
Maternal Age At EDD: 34.7 yr
OSBR Risk 1 IN: 7201
Test Results:: NEGATIVE
Weight: 164 [lb_av]

## 2022-07-10 ENCOUNTER — Other Ambulatory Visit: Payer: Self-pay

## 2022-07-10 ENCOUNTER — Ambulatory Visit (INDEPENDENT_AMBULATORY_CARE_PROVIDER_SITE_OTHER): Payer: Medicaid Other | Admitting: Advanced Practice Midwife

## 2022-07-10 VITALS — BP 130/78 | HR 71 | Wt 166.4 lb

## 2022-07-10 DIAGNOSIS — Z3480 Encounter for supervision of other normal pregnancy, unspecified trimester: Secondary | ICD-10-CM

## 2022-07-10 DIAGNOSIS — Z3482 Encounter for supervision of other normal pregnancy, second trimester: Secondary | ICD-10-CM

## 2022-07-10 DIAGNOSIS — Z3A19 19 weeks gestation of pregnancy: Secondary | ICD-10-CM

## 2022-07-10 DIAGNOSIS — Z148 Genetic carrier of other disease: Secondary | ICD-10-CM

## 2022-07-10 NOTE — Progress Notes (Signed)
PRENATAL VISIT NOTE- Centering Pregnancy Cycle 8, Session # 2 and 3  Subjective:  Kristy James is a 36 y.o. O1I1030 at 52w4dbeing seen today for ongoing prenatal care through Centering Pregnancy.  She is currently monitored for the following issues for this low-risk pregnancy and has Herpes genitalis in women; Carrier of spinal muscular atrophy; and Supervision of other normal pregnancy, antepartum on their problem list.  Patient reports no complaints.  Contractions: Not present.  .  Movement: Present. Denies leaking of fluid/ROM.   The following portions of the patient's history were reviewed and updated as appropriate: allergies, current medications, past family history, past medical history, past social history, past surgical history and problem list. Problem list updated.  Objective:   Vitals:   07/10/22 1210  BP: 130/78  Pulse: 71  Weight: 166 lb 6.4 oz (75.5 kg)    Fetal Status: Fetal Heart Rate (bpm): 147 Fundal Height: 20 cm Movement: Present     General:  Alert, oriented and cooperative. Patient is in no acute distress.  Skin: Skin is warm and dry. No rash noted.   Cardiovascular: Normal heart rate noted  Respiratory: Normal respiratory effort, no problems with respiration noted  Abdomen: Soft, gravid, appropriate for gestational age.  Pain/Pressure: Absent     Pelvic: Cervical exam deferred        Extremities: Normal range of motion.     Mental Status: Normal mood and affect. Normal behavior. Normal judgment and thought content.   Assessment and Plan:  Pregnancy: [id number] 136w4d1. Supervision of other normal pregnancy, antepartum  Centering Pregnancy, Session#2/3: Reviewed rules for self-governance with group. Direct group to [employer]  Facilitated discussion today:  Nutrition, Oral Health, Breastfeeding  Mindfulness activity completed with 3 deep breaths.    Fundal height and FHR appropriate today unless noted otherwise in plan. Patient  to continue group care.     2. Carrier of spinal muscular atrophy --Kit for partner testing given  3. [redacted] weeks gestation of pregnancy    Preterm labor symptoms and general obstetric precautions including but not limited to vaginal bleeding, contractions, leaking of fluid and fetal movement were reviewed in detail with the patient. Please refer to After Visit Summary for other counseling recommendations.  Return for Centering Sessions as scheduled.  Future Appointments  Date Time Provider [place]11/30/2023  3:30 PM [facility]11/30/2023  3:45 PM WMC-MFC US4 [facility]12/12/2021  9:00 AM CENTERING PROVIDER [facility]12/18/2023  9:00 AM CENTERING PROVIDER WMC-CWH [facility]1/10/2022  9:00 AM CENTERING PROVIDER [facility]-C[facility]1/15/2024  9:00 AM CENTERING PROVIDER WME[facility]1/29/2024  9:00 AM CENTERING PROVIDER WMBet[facility] HospitalM[facility]2/08/2023  9:00 AM CENTERING PROVIDER WMSog Surgery [facility]CM[facility]2/26/2024  9:00 AM CENTERING PROVIDER WMC-CWH [facility]  LiFatima BlankCNM

## 2022-07-11 ENCOUNTER — Encounter: Payer: Self-pay | Admitting: *Deleted

## 2022-08-03 ENCOUNTER — Ambulatory Visit: Payer: Medicaid Other | Admitting: *Deleted

## 2022-08-03 ENCOUNTER — Ambulatory Visit: Payer: Medicaid Other | Attending: Obstetrics

## 2022-08-03 VITALS — BP 121/64 | HR 80

## 2022-08-03 DIAGNOSIS — Z3480 Encounter for supervision of other normal pregnancy, unspecified trimester: Secondary | ICD-10-CM | POA: Diagnosis present

## 2022-08-03 DIAGNOSIS — O99891 Other specified diseases and conditions complicating pregnancy: Secondary | ICD-10-CM | POA: Diagnosis not present

## 2022-08-03 DIAGNOSIS — Z3A23 23 weeks gestation of pregnancy: Secondary | ICD-10-CM | POA: Diagnosis not present

## 2022-08-03 DIAGNOSIS — Z148 Genetic carrier of other disease: Secondary | ICD-10-CM | POA: Diagnosis not present

## 2022-08-03 DIAGNOSIS — O321XX0 Maternal care for breech presentation, not applicable or unspecified: Secondary | ICD-10-CM | POA: Insufficient documentation

## 2022-08-03 DIAGNOSIS — Z362 Encounter for other antenatal screening follow-up: Secondary | ICD-10-CM | POA: Diagnosis not present

## 2022-08-07 ENCOUNTER — Ambulatory Visit (INDEPENDENT_AMBULATORY_CARE_PROVIDER_SITE_OTHER): Payer: Medicaid Other | Admitting: Advanced Practice Midwife

## 2022-08-07 VITALS — BP 124/78 | HR 90 | Wt 173.2 lb

## 2022-08-07 DIAGNOSIS — Z3480 Encounter for supervision of other normal pregnancy, unspecified trimester: Secondary | ICD-10-CM

## 2022-08-07 DIAGNOSIS — Z3A24 24 weeks gestation of pregnancy: Secondary | ICD-10-CM

## 2022-08-14 NOTE — Progress Notes (Signed)
       PRENATAL VISIT NOTE- Centering Pregnancy Cycle 8, Session # 4  Subjective:  Kristy James is a 36 y.o. [id number] at 48w4dbeing seen today for ongoing prenatal care through Centering Pregnancy.  She is currently monitored for the following issues for this low-risk pregnancy and has Herpes genitalis in women; Carrier of spinal muscular atrophy; and Supervision of other normal pregnancy, antepartum on their problem list.  Patient reports no complaints.  Contractions: Not present. Vag. Bleeding: None.  Movement: Present. Denies leaking of fluid/ROM.   The following portions of the patient's history were reviewed and updated as appropriate: allergies, current medications, past family history, past medical history, past social history, past surgical history and problem list. Problem list updated.  Objective:   Vitals:   08/07/22 0937  BP: 124/78  Pulse: 90  Weight: 173 lb 3.2 oz (78.6 kg)    Fetal Status: Fetal Heart Rate (bpm): 156 Fundal Height: 23 cm Movement: Present     General:  Alert, oriented and cooperative. Patient is in no acute distress.  Skin: Skin is warm and dry. No rash noted.   Cardiovascular: Normal heart rate noted  Respiratory: Normal respiratory effort, no problems with respiration noted  Abdomen: Soft, gravid, appropriate for gestational age.  Pain/Pressure: Absent     Pelvic: Cervical exam deferred        Extremities: Normal range of motion.     Mental Status: Normal mood and affect. Normal behavior. Normal judgment and thought content.   Assessment and Plan:  Pregnancy: [id number] 251w4d1. Supervision of other normal pregnancy, antepartum  Centering Pregnancy, Session#4: Reviewed resources in [employer]  Facilitated discussion today:  Family planning intimate partner violence, and preterm labor Mindfulness activity completed.   Fundal height and FHR appropriate today unless noted otherwise in plan. Patient to continue group care.    2.  [redacted] weeks gestation of pregnancy     Preterm labor symptoms and general obstetric precautions including but not limited to vaginal bleeding, contractions, leaking of fluid and fetal movement were reviewed in detail with the patient. Please refer to After Visit Summary for other counseling recommendations.  No follow-ups on file.  Future Appointments  Date Time Provider [place]12/18/2023  9:00 AM CENTERING PROVIDER [facility]North[facility] Hospital[facility]1/10/2022  9:00 AM CENTERING PROVIDER [facility]1/11/2022  8:20 AM WMC-WOCA LAB WMC-CWH WMCch[facility]oscopy [facility]1/15/2024  9:00 AM CENTERING PROVIDER WMMorris VillageMColumb[facility] Eye Surgery Center1/29/2024  9:00 AM CENTERING PROVIDER WMProv[facility] CenterMNe[facility]2/08/2023  9:00 AM CENTERING PROVIDER WMVa [facility]2/26/2024  9:00 AM CENTERING PROVIDER WMC-CWH WMBaylor [facility]  LiFatima BlankCNM

## 2022-08-21 ENCOUNTER — Encounter: Payer: Self-pay | Admitting: Advanced Practice Midwife

## 2022-08-21 ENCOUNTER — Ambulatory Visit (INDEPENDENT_AMBULATORY_CARE_PROVIDER_SITE_OTHER): Payer: Medicaid Other | Admitting: Advanced Practice Midwife

## 2022-08-21 VITALS — BP 109/71 | HR 85 | Wt 172.0 lb

## 2022-08-21 DIAGNOSIS — Z3A25 25 weeks gestation of pregnancy: Secondary | ICD-10-CM

## 2022-08-21 DIAGNOSIS — Z3480 Encounter for supervision of other normal pregnancy, unspecified trimester: Secondary | ICD-10-CM

## 2022-08-21 DIAGNOSIS — Z148 Genetic carrier of other disease: Secondary | ICD-10-CM

## 2022-08-21 NOTE — Progress Notes (Signed)
       PRENATAL VISIT NOTE- Centering Pregnancy Cycle 8, Session # 5  Subjective:  Kristy James is a 36 y.o. H3Z1696 at 39w4dbeing seen today for ongoing prenatal care through Centering Pregnancy.  She is currently monitored for the following issues for this low-risk pregnancy and has Herpes genitalis in women; Carrier of spinal muscular atrophy; and Supervision of other normal pregnancy, antepartum on their problem list.  Patient reports no complaints.  Contractions: Not present. Vag. Bleeding: None.  Movement: Present. Denies leaking of fluid/ROM.   The following portions of the patient's history were reviewed and updated as appropriate: allergies, current medications, past family history, past medical history, past social history, past surgical history and problem list. Problem list updated.  Objective:   Vitals:   08/21/22 0911  BP: 109/71  Pulse: 85  Weight: 172 lb (78 kg)    Fetal Status: Fetal Heart Rate (bpm): 147 Fundal Height: 26 cm Movement: Present     General:  Alert, oriented and cooperative. Patient is in no acute distress.  Skin: Skin is warm and dry. No rash noted.   Cardiovascular: Normal heart rate noted  Respiratory: Normal respiratory effort, no problems with respiration noted  Abdomen: Soft, gravid, appropriate for gestational age.  Pain/Pressure: Absent     Pelvic: Cervical exam deferred        Extremities: Normal range of motion.  Edema: None  Mental Status: Normal mood and affect. Normal behavior. Normal judgment and thought content.   Assessment and Plan:  Pregnancy: [id number] 2108w4d1. Supervision of other normal pregnancy, antepartum  Centering Pregnancy, Session#5: Reviewed resources in [employer]  Facilitated discussion today:  Signs of labor, labor positions, coping strategies/support measures, preterm labor, choosing pediatrician and carseat safety.  Fundal height and FHR appropriate today unless noted otherwise in plan. Patient  to continue group care.     2. Carrier of spinal muscular atrophy --Partner testing pending  3. [redacted] weeks gestation of pregnancy     Preterm labor symptoms and general obstetric precautions including but not limited to vaginal bleeding, contractions, leaking of fluid and fetal movement were reviewed in detail with the patient. Please refer to After Visit Summary for other counseling recommendations.  No follow-ups on file.  Future Appointments  Date Time Provider [place]1/10/2022  9:00 AM CENTERING PROVIDER WMWo[facility] Surgery CenterML[facility]1/11/2022  8:20 AM WMC-WOCA LAB WMC-CWH WMM[facility]1/15/2024  9:00 AM CENTERING PROVIDER WMClement J[facility] Zablock[facility] Va Medical CenterMSheridan [facility]1/29/2024  9:00 AM CENTERING PROVIDER WMSouthwest Hospital [facility] Medical CenterMSaint [facility]2/08/2023  9:00 AM CENTERING PROVIDER WMThe Cooper University HospitalMCenter For Gastrointestinal Endocsopy2/26/2024  9:00 AM CENTERING PROVIDER WMC-CWH WMChi Health Schuyler  LiFatima BlankCNM

## 2022-08-21 NOTE — Patient Instructions (Signed)
Reasons to return to MAU at [facility]:  Since you are preterm, return to MAU if:  1.  Contractions are 10 minutes apart or less and they becoming more uncomfortable or painful over time 2.  You have a large gush of fluid, or a trickle of fluid that will not stop and you have to wear a pad 3.  You have bleeding that is bright red, heavier than spotting--like menstrual bleeding (spotting can be normal in early labor or after a check of your cervix) 4.  You do not feel the baby moving like he/she normally does  

## 2022-08-30 ENCOUNTER — Other Ambulatory Visit: Payer: Self-pay

## 2022-08-30 DIAGNOSIS — Z3480 Encounter for supervision of other normal pregnancy, unspecified trimester: Secondary | ICD-10-CM

## 2022-09-04 NOTE — L&D Delivery Note (Cosign Needed Addendum)
Delivery Note Zemora Gmerek is a 37 y.o. [id number] at [redacted]w[redacted]d admitted for labor.   GBS Status:  Negative/-- (03/11 1647) Maximum Maternal Temperature: 36.4C  Labor course: Initial SVE: 8/90/-2. Augmentation with: N/A. Patient desired epidural but SROM'd to thin mec upon arrival and was involuntarily pushing.   ROM: 0h 32m with thin mec fluid  Birth: At [phone] a viable female was delivered via spontaneous vaginal delivery (Presentation: direct OA;  ). Nuchal cord present: No.  Shoulders and body delivered in usual fashion. Infant placed directly on mom's abdomen for bonding/skin-to-skin, baby dried and stimulated. Cord clamped x 2 after 1 minute and cut by FOB.  Cord blood collected.  The placenta separated spontaneously and delivered via gentle cord traction.  Pitocin infused rapidly IV per protocol.  Fundus firm with massage.  Placenta inspected and appears to be intact with a 3 VC.  Placenta/Cord with the following complications: none. Sponge and instrument count were correct x2.  Intrapartum complications:  None Anesthesia:  none Episiotomy: none Lacerations:  none Suture Repair:  N/A EBL (mL): 56cc   Infant: APGAR (1 MIN): 9   APGAR (5 MINS): 9   APGAR (10 MINS):    Infant weight: pending  Mom to postpartum.  Baby to Couplet care / Skin to Skin. Placenta to L&D   Plans to Breastfeed Contraception: none Circumcision: wants inpatient  Delivery was attended by Dr. Concepcion Living. Jiayu "Darlyne Russian, M.D. PGY-2 Family Medicine Visiting Resident Faculty Practice 11/24/2022 4:46 AM   Fellow ATTESTATION  I was present and gloved for this delivery and agree with the above documentation in the resident's note except as below.   Concepcion Living, MD [facility] [employer] (Faculty Practice) 11/24/2022, 7:59 PM

## 2022-09-06 ENCOUNTER — Other Ambulatory Visit: Payer: Medicaid Other

## 2022-09-06 ENCOUNTER — Other Ambulatory Visit: Payer: Self-pay

## 2022-09-06 DIAGNOSIS — Z3480 Encounter for supervision of other normal pregnancy, unspecified trimester: Secondary | ICD-10-CM

## 2022-09-07 LAB — GLUCOSE TOLERANCE, 2 HOURS W/ 1HR
Glucose, 1 hour: 122 mg/dL (ref 70–179)
Glucose, 2 hour: 73 mg/dL (ref 70–152)
Glucose, Fasting: 72 mg/dL (ref 70–91)

## 2022-09-07 LAB — CBC
Hematocrit: 35.2 % (ref 34.0–46.6)
Hemoglobin: 11.6 g/dL (ref 11.1–15.9)
MCH: 30.1 pg (ref 26.6–33.0)
MCHC: 33 g/dL (ref 31.5–35.7)
MCV: 91 fL (ref 79–97)
Platelets: 270 10*3/uL (ref 150–450)
RBC: 3.85 x10E6/uL (ref 3.77–5.28)
RDW: 12.8 % (ref 11.7–15.4)
WBC: 6.7 10*3/uL (ref 3.4–10.8)

## 2022-09-07 LAB — HIV ANTIBODY (ROUTINE TESTING W REFLEX): HIV Screen 4th Generation wRfx: NONREACTIVE

## 2022-09-07 LAB — RPR: RPR Ser Ql: NONREACTIVE

## 2022-09-18 ENCOUNTER — Ambulatory Visit (INDEPENDENT_AMBULATORY_CARE_PROVIDER_SITE_OTHER): Payer: Medicaid Other | Admitting: Advanced Practice Midwife

## 2022-09-18 VITALS — BP 121/77 | HR 103 | Wt 175.0 lb

## 2022-09-18 DIAGNOSIS — Z23 Encounter for immunization: Secondary | ICD-10-CM

## 2022-09-18 DIAGNOSIS — Z3480 Encounter for supervision of other normal pregnancy, unspecified trimester: Secondary | ICD-10-CM

## 2022-09-18 DIAGNOSIS — Z3A29 29 weeks gestation of pregnancy: Secondary | ICD-10-CM

## 2022-09-18 NOTE — Addendum Note (Signed)
Addended by: Michel Harrow on: 09/18/2022 04:12 PM   Modules accepted: Orders

## 2022-09-18 NOTE — Progress Notes (Signed)
PRENATAL VISIT NOTE- Centering Pregnancy Cycle 8, Session # 7  Subjective:  Kristy James is a 37 y.o. Y5W3893 at 61w4dbeing seen today for ongoing prenatal care through Centering Pregnancy.  She is currently monitored for the following issues for this low-risk pregnancy and has Herpes genitalis in women; Carrier of spinal muscular atrophy; and Supervision of other normal pregnancy, antepartum on their problem list.  Patient reports no complaints.   . Vag. Bleeding: None.  Movement: Present. Denies leaking of fluid/ROM.   The following portions of the patient's history were reviewed and updated as appropriate: allergies, current medications, past family history, past medical history, past social history, past surgical history and problem list. Problem list updated.  Objective:   Vitals:   09/18/22 0941  BP: 121/77  Pulse: (!) 103  Weight: 175 lb (79.4 kg)    Fetal Status: Fetal Heart Rate (bpm): 143 Fundal Height: 29 cm Movement: Present     General:  Alert, oriented and cooperative. Patient is in no acute distress.  Skin: Skin is warm and dry. No rash noted.   Cardiovascular: Normal heart rate noted  Respiratory: Normal respiratory effort, no problems with respiration noted  Abdomen: Soft, gravid, appropriate for gestational age.  Pain/Pressure: Absent     Pelvic: Cervical exam deferred        Extremities: Normal range of motion.  Edema: None  Mental Status: Normal mood and affect. Normal behavior. Normal judgment and thought content.   Assessment and Plan:  Pregnancy: [id number] 246w4d1. Supervision of other normal pregnancy, antepartum  Centering Pregnancy, Session#7: Reviewed resources in [employer]  Facilitated discussion today:  preterm labor and pregnancy warning signs, newborn procedures/hospital procedures, breastfeeding  Mindfulness activity with mindful listening  Fundal height and FHR appropriate today unless noted otherwise in plan. Patient  to continue group care.    2. [redacted] weeks gestation of pregnancy    Preterm labor symptoms and general obstetric precautions including but not limited to vaginal bleeding, contractions, leaking of fluid and fetal movement were reviewed in detail with the patient. Please refer to After Visit Summary for other counseling recommendations.  Return for Centering Sessions as scheduled.  Future Appointments  Date Time Provider [place]1/29/2024  9:00 AM CENTERING PROVIDER WMSheridan Va Medical CenterMEn[facility]2/08/2023  9:00 AM CENTERING PROVIDER WMAscension [facility] H[facility]MMetroeast [facility]2/26/2024  9:00 AM CENTERING PROVIDER WMNorth [facility]aval[facility]MSt[facility]  LiFatima BlankCNM

## 2022-10-02 ENCOUNTER — Encounter: Payer: Self-pay | Admitting: Advanced Practice Midwife

## 2022-10-02 ENCOUNTER — Ambulatory Visit (INDEPENDENT_AMBULATORY_CARE_PROVIDER_SITE_OTHER): Payer: Medicaid Other | Admitting: Advanced Practice Midwife

## 2022-10-02 VITALS — BP 123/78 | HR 90 | Wt 174.0 lb

## 2022-10-02 DIAGNOSIS — Z3A31 31 weeks gestation of pregnancy: Secondary | ICD-10-CM

## 2022-10-02 DIAGNOSIS — Z3483 Encounter for supervision of other normal pregnancy, third trimester: Secondary | ICD-10-CM

## 2022-10-02 DIAGNOSIS — Z3480 Encounter for supervision of other normal pregnancy, unspecified trimester: Secondary | ICD-10-CM

## 2022-10-02 NOTE — Progress Notes (Signed)
PRENATAL VISIT NOTE- Centering Pregnancy Cycle 8, Session # 8  Subjective:  Kristy James is a 37 y.o. [id number] at 63w4dbeing seen today for ongoing prenatal care through Centering Pregnancy.  She is currently monitored for the following issues for this low-risk pregnancy and has Herpes genitalis in women; Carrier of spinal muscular atrophy; and Supervision of other normal pregnancy, antepartum on their problem list.  Patient reports no complaints.  Contractions: Not present. Vag. Bleeding: None.  Movement: Present. Denies leaking of fluid/ROM.   The following portions of the patient's history were reviewed and updated as appropriate: allergies, current medications, past family history, past medical history, past social history, past surgical history and problem list. Problem list updated.  Objective:   Vitals:   10/02/22 1223  BP: 123/78  Pulse: 90  Weight: 174 lb (78.9 kg)    Fetal Status: Fetal Heart Rate (bpm): 140 Fundal Height: 31 cm Movement: Present     General:  Alert, oriented and cooperative. Patient is in no acute distress.  Skin: Skin is warm and dry. No rash noted.   Cardiovascular: Normal heart rate noted  Respiratory: Normal respiratory effort, no problems with respiration noted  Abdomen: Soft, gravid, appropriate for gestational age.  Pain/Pressure: Absent     Pelvic: Cervical exam deferred        Extremities: Normal range of motion.  Edema: None  Mental Status: Normal mood and affect. Normal behavior. Normal judgment and thought content.   Assessment and Plan:  Pregnancy: [id number] 363w4d1. Supervision of other normal pregnancy, antepartum  Centering Pregnancy, Session#8: Reviewed resources in [employer]  Facilitated discussion today:  Newborn safety, perinatal mood disorders/intimate partner violence, birth planning  Fundal height and FHR appropriate today unless noted otherwise in plan. Patient to continue group care.    2. [redacted] weeks  gestation of pregnancy      Preterm labor symptoms and general obstetric precautions including but not limited to vaginal bleeding, contractions, leaking of fluid and fetal movement were reviewed in detail with the patient. Please refer to After Visit Summary for other counseling recommendations.  Return in about 2 weeks (around 10/16/2022) for Centering Sessions as scheduled.  Future Appointments  Date Time Provider De[place]2/08/2023  9:00 AM CENTERING PROVIDER [facility]/26/2024  9:00 AM CENTERING PROVIDER [facility]  LiFatima BlankCNM

## 2022-10-11 ENCOUNTER — Encounter: Payer: Self-pay | Admitting: Family Medicine

## 2022-10-16 ENCOUNTER — Ambulatory Visit (INDEPENDENT_AMBULATORY_CARE_PROVIDER_SITE_OTHER): Payer: Medicaid Other | Admitting: Advanced Practice Midwife

## 2022-10-16 VITALS — BP 116/76 | HR 92 | Wt 178.0 lb

## 2022-10-16 DIAGNOSIS — Z3480 Encounter for supervision of other normal pregnancy, unspecified trimester: Secondary | ICD-10-CM

## 2022-10-16 DIAGNOSIS — Z3483 Encounter for supervision of other normal pregnancy, third trimester: Secondary | ICD-10-CM

## 2022-10-16 DIAGNOSIS — Z3A33 33 weeks gestation of pregnancy: Secondary | ICD-10-CM

## 2022-10-21 ENCOUNTER — Encounter: Payer: Self-pay | Admitting: Advanced Practice Midwife

## 2022-10-21 ENCOUNTER — Other Ambulatory Visit (HOSPITAL_BASED_OUTPATIENT_CLINIC_OR_DEPARTMENT_OTHER): Payer: Self-pay | Admitting: Advanced Practice Midwife

## 2022-10-21 DIAGNOSIS — Z3480 Encounter for supervision of other normal pregnancy, unspecified trimester: Secondary | ICD-10-CM

## 2022-10-21 DIAGNOSIS — A6009 Herpesviral infection of other urogenital tract: Secondary | ICD-10-CM

## 2022-10-21 DIAGNOSIS — Z3A33 33 weeks gestation of pregnancy: Secondary | ICD-10-CM

## 2022-10-21 MED ORDER — VALACYCLOVIR HCL 500 MG PO TABS: 500.0000 mg | ORAL_TABLET | Freq: Two times a day (BID) | ORAL | 1 refills | Status: DC

## 2022-10-21 NOTE — Progress Notes (Signed)
       PRENATAL VISIT NOTE- Centering Pregnancy Cycle 8, Session # 9  Subjective:  Kristy James is a 37 y.o. [id number] at 90w2dbeing seen today for ongoing prenatal care through Centering Pregnancy.  She is currently monitored for the following issues for this low-risk pregnancy and has Herpes genitalis in women; Carrier of spinal muscular atrophy; and Supervision of other normal pregnancy, antepartum on their problem list.  Patient reports no complaints.  Contractions: Not present.  .  Movement: Present. Denies leaking of fluid/ROM.   The following portions of the patient's history were reviewed and updated as appropriate: allergies, current medications, past family history, past medical history, past social history, past surgical history and problem list. Problem list updated.  Objective:   Vitals:   10/16/22 0933  BP: 116/76  Pulse: 92  Weight: 178 lb (80.7 kg)    Fetal Status: Fetal Heart Rate (bpm): 144 Fundal Height: 33 cm Movement: Present     General:  Alert, oriented and cooperative. Patient is in no acute distress.  Skin: Skin is warm and dry. No rash noted.   Cardiovascular: Normal heart rate noted  Respiratory: Normal respiratory effort, no problems with respiration noted  Abdomen: Soft, gravid, appropriate for gestational age.  Pain/Pressure: Absent     Pelvic: Cervical exam deferred        Extremities: Normal range of motion.  Edema: None  Mental Status: Normal mood and affect. Normal behavior. Normal judgment and thought content.   Assessment and Plan:  Pregnancy: [id number] 333w2d1. Supervision of other normal pregnancy, antepartum  Centering Pregnancy, Session#9: Reviewed resources in [employer]  Facilitated discussion today:  breastfeeding and labor review  with facilitated discussion. Mindfulness activity.  Fundal height and FHR appropriate today unless noted otherwise in plan. Patient to continue group care.   - Culture, beta strep (group b  only) - Cervicovaginal ancillary only( [place])   2. [redacted] weeks gestation of pregnancy    Preterm labor symptoms and general obstetric precautions including but not limited to vaginal bleeding, contractions, leaking of fluid and fetal movement were reviewed in detail with the patient. Please refer to After Visit Summary for other counseling recommendations.  No follow-ups on file.  Future Appointments  Date Time Provider De[place]3/12/2022  9:15 AM BaGriffin BasilMD [facility]  LiFatima BlankCNM

## 2022-10-21 NOTE — Progress Notes (Unsigned)
       PRENATAL VISIT NOTE- Centering Pregnancy Cycle 8, Session # 9  Subjective:  Kristy James is a 37 y.o. [id number] at 51w2dbeing seen today for ongoing prenatal care through Centering Pregnancy.  She is currently monitored for the following issues for this low-risk pregnancy and has Herpes genitalis in women; Carrier of spinal muscular atrophy; and Supervision of other normal pregnancy, antepartum on their problem list.  Patient reports no complaints.   .  .   . ***Denies leaking of fluid/ROM.   The following portions of the patient's history were reviewed and updated as appropriate: allergies, current medications, past family history, past medical history, past social history, past surgical history and problem list. Problem list updated.  Objective:  There were no vitals filed for this visit.  Fetal Status:           General:  Alert, oriented and cooperative. Patient is in no acute distress.  Skin: Skin is warm and dry. No rash noted.   Cardiovascular: Normal heart rate noted  Respiratory: Normal respiratory effort, no problems with respiration noted  Abdomen: Soft, gravid, appropriate for gestational age.        Pelvic: {Blank single:19197::"Cervical exam performed","Cervical exam deferred"}        Extremities: Normal range of motion.     Mental Status: Normal mood and affect. Normal behavior. Normal judgment and thought content.   Assessment and Plan:  Pregnancy: [id number] 352w2d1. 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  Dispense: 30 tablet; Refill: 1  2. Supervision of other normal pregnancy, antepartum  Centering Pregnancy, Session#9: Reviewed resources in [employer]  Facilitated discussion today:  breastfeeding and labor review  with facilitated discussion. Mindfulness  activity.  Fundal height and FHR appropriate today unless noted otherwise in plan. Patient to continue group care.   - Culture, beta strep (group b only) - Cervicovaginal ancillary only( )   3. [redacted] weeks gestation of pregnancy    Preterm labor symptoms and general obstetric precautions including but not limited to vaginal bleeding, contractions, leaking of fluid and fetal movement were reviewed in detail with the patient. Please refer to After Visit Summary for other counseling recommendations.  No follow-ups on file.  Future Appointments  Date Time Provider De[place]2/26/2024  9:00 AM CENTERING PROVIDER [facility]3/12/2022  9:15 AM BaGriffin BasilMD [facility]  LiFatima BlankCNM

## 2022-10-27 NOTE — Progress Notes (Signed)
Alert received through NCNotify system as follows: Patient identified as not having a recommended prenatal visit. Patient is 34 weeks and 5 days pregnant. Most recent prenatal visit was on 10/02/2022 at [place].  Chart review: Patient's last visit was on 10/16/2022.  No action needed.  Martina Sinner, RN 10/27/2022  11:53 AM

## 2022-10-30 ENCOUNTER — Encounter: Payer: Self-pay | Admitting: Advanced Practice Midwife

## 2022-11-06 ENCOUNTER — Encounter: Payer: Medicaid Other | Admitting: Obstetrics and Gynecology

## 2022-11-06 ENCOUNTER — Encounter: Payer: Medicaid Other | Admitting: Certified Nurse Midwife

## 2022-11-13 ENCOUNTER — Other Ambulatory Visit (HOSPITAL_COMMUNITY)
Admission: RE | Admit: 2022-11-13 | Discharge: 2022-11-13 | Disposition: A | Discharge status: Home / Self Care | Payer: Medicaid Other | Source: Ambulatory Visit | Attending: Obstetrics and Gynecology | Admitting: Obstetrics and Gynecology

## 2022-11-13 ENCOUNTER — Ambulatory Visit (INDEPENDENT_AMBULATORY_CARE_PROVIDER_SITE_OTHER): Payer: Medicaid Other | Admitting: Family Medicine

## 2022-11-13 ENCOUNTER — Other Ambulatory Visit: Payer: Self-pay

## 2022-11-13 VITALS — BP 124/68 | HR 101 | Wt 182.0 lb

## 2022-11-13 DIAGNOSIS — Z3493 Encounter for supervision of normal pregnancy, unspecified, third trimester: Secondary | ICD-10-CM | POA: Diagnosis present

## 2022-11-13 DIAGNOSIS — Z3A37 37 weeks gestation of pregnancy: Secondary | ICD-10-CM

## 2022-11-13 DIAGNOSIS — A6009 Herpesviral infection of other urogenital tract: Secondary | ICD-10-CM

## 2022-11-13 NOTE — Progress Notes (Signed)
   PRENATAL VISIT NOTE  Subjective:  Kristy James is a 37 y.o. [id number] at [redacted]w[redacted]d being seen today for ongoing prenatal care.  She is currently monitored for the following issues for this {Blank single:19197::"high-risk","low-risk"} pregnancy and has Herpes genitalis in women; Carrier of spinal muscular atrophy; and Supervision of other normal pregnancy, antepartum on their problem list.  Patient reports {[id number]}.  Contractions: Irregular. Vag. Bleeding: None.  Movement: Present. Denies leaking of fluid.   The following portions of the patient's history were reviewed and updated as appropriate: allergies, current medications, past family history, past medical history, past social history, past surgical history and problem list.   Objective:   Vitals:   11/13/22 1524  BP: 124/68  Pulse: (!) 101  Weight: 182 lb (82.6 kg)    Fetal Status: Fetal Heart Rate (bpm): 152   Movement: Present     General:  Alert, oriented and cooperative. Patient is in no acute distress.  Skin: Skin is warm and dry. No rash noted.   Cardiovascular: Normal heart rate noted  Respiratory: Normal respiratory effort, no problems with respiration noted  Abdomen: Soft, gravid, appropriate for gestational age.  Pain/Pressure: Present     Pelvic: {Blank single:19197::"Cervical exam performed in the presence of a chaperone","Cervical exam deferred"}        Extremities: Normal range of motion.  Edema: None  Mental Status: Normal mood and affect. Normal behavior. Normal judgment and thought content.   Assessment and Plan:  Pregnancy: [id number] at [redacted]w[redacted]d There are no diagnoses linked to this encounter. {Blank single:19197::"Term","Preterm"} labor symptoms and general obstetric precautions including but not limited to vaginal bleeding, contractions, leaking of fluid and fetal movement were reviewed in detail with the patient. Please refer to After Visit Summary for other counseling recommendations.   No follow-ups on  file.  No future appointments.  Axten Pascucci Autry-Lott, DO

## 2022-11-14 LAB — CERVICOVAGINAL ANCILLARY ONLY
Chlamydia: NEGATIVE
Comment: NEGATIVE
Comment: NORMAL
Neisseria Gonorrhea: NEGATIVE

## 2022-11-17 LAB — CULTURE, BETA STREP (GROUP B ONLY): Strep Gp B Culture: NEGATIVE

## 2022-11-21 ENCOUNTER — Ambulatory Visit (INDEPENDENT_AMBULATORY_CARE_PROVIDER_SITE_OTHER): Payer: Medicaid Other | Admitting: Obstetrics and Gynecology

## 2022-11-21 ENCOUNTER — Other Ambulatory Visit: Payer: Self-pay

## 2022-11-21 VITALS — BP 123/77 | HR 82 | Wt 186.0 lb

## 2022-11-21 DIAGNOSIS — A6009 Herpesviral infection of other urogenital tract: Secondary | ICD-10-CM

## 2022-11-21 DIAGNOSIS — Z148 Genetic carrier of other disease: Secondary | ICD-10-CM

## 2022-11-21 DIAGNOSIS — Z3480 Encounter for supervision of other normal pregnancy, unspecified trimester: Secondary | ICD-10-CM

## 2022-11-21 DIAGNOSIS — Z3490 Encounter for supervision of normal pregnancy, unspecified, unspecified trimester: Secondary | ICD-10-CM

## 2022-11-21 DIAGNOSIS — Z3A38 38 weeks gestation of pregnancy: Secondary | ICD-10-CM

## 2022-11-21 DIAGNOSIS — Z3493 Encounter for supervision of normal pregnancy, unspecified, third trimester: Secondary | ICD-10-CM

## 2022-11-21 NOTE — Progress Notes (Signed)
   PRENATAL VISIT NOTE  Subjective:  Kristy James is a 37 y.o. [id number] at [redacted]w[redacted]d being seen today for ongoing prenatal care.  She is currently monitored for the following issues for this low-risk pregnancy and has Herpes genitalis in women; Carrier of spinal muscular atrophy; and Supervision of other normal pregnancy, antepartum on their problem list.  Patient doing well with no acute concerns today. She reports occasional contractions.  Contractions: Irregular. Vag. Bleeding: None.  Movement: Present. Denies leaking of fluid.   The following portions of the patient's history were reviewed and updated as appropriate: allergies, current medications, past family history, past medical history, past social history, past surgical history and problem list. Problem list updated.  Objective:   Vitals:   11/21/22 1618  BP: 123/77  Pulse: 82  Weight: 186 lb (84.4 kg)    Fetal Status: Fetal Heart Rate (bpm): 150 Fundal Height: 39 cm Movement: Present     General:  Alert, oriented and cooperative. Patient is in no acute distress.  Skin: Skin is warm and dry. No rash noted.   Cardiovascular: Normal heart rate noted  Respiratory: Normal respiratory effort, no problems with respiration noted  Abdomen: Soft, gravid, appropriate for gestational age.  Pain/Pressure: Present     Pelvic: Cervical exam deferred        Extremities: Normal range of motion.  Edema: Trace  Mental Status:  Normal mood and affect. Normal behavior. Normal judgment and thought content.   Assessment and Plan:  Pregnancy: G5P3013 at [redacted]w[redacted]d  1. [redacted] weeks gestation of pregnancy   2. Herpes genitalis in women Pt complaint with valtrex  3. Supervision of other normal pregnancy, antepartum Continue routine prenatal care, IOL scheduled at 40.5  4. Carrier of spinal muscular atrophy   Term labor symptoms and general obstetric precautions including but not limited to vaginal bleeding, contractions, leaking of fluid and fetal  movement were reviewed in detail with the patient.  Please refer to After Visit Summary for other counseling recommendations.   Return in about 1 week (around 11/28/2022).   Lynnda Shields, MD Faculty Attending [facility]

## 2022-11-24 ENCOUNTER — Encounter (HOSPITAL_COMMUNITY): Payer: Self-pay | Admitting: Obstetrics and Gynecology

## 2022-11-24 ENCOUNTER — Inpatient Hospital Stay (HOSPITAL_COMMUNITY)
Admission: AD | Admit: 2022-11-24 | Discharge: 2022-11-26 | DRG: 806 | Disposition: A | Discharge status: Home / Self Care | Payer: Medicaid Other | Attending: Obstetrics and Gynecology | Admitting: Obstetrics and Gynecology

## 2022-11-24 ENCOUNTER — Other Ambulatory Visit: Payer: Self-pay

## 2022-11-24 DIAGNOSIS — Z3480 Encounter for supervision of other normal pregnancy, unspecified trimester: Secondary | ICD-10-CM

## 2022-11-24 DIAGNOSIS — A6000 Herpesviral infection of urogenital system, unspecified: Secondary | ICD-10-CM | POA: Diagnosis present

## 2022-11-24 DIAGNOSIS — Z148 Genetic carrier of other disease: Secondary | ICD-10-CM

## 2022-11-24 DIAGNOSIS — Z3490 Encounter for supervision of normal pregnancy, unspecified, unspecified trimester: Secondary | ICD-10-CM

## 2022-11-24 DIAGNOSIS — O480 Post-term pregnancy: Principal | ICD-10-CM | POA: Diagnosis present

## 2022-11-24 DIAGNOSIS — Z3A39 39 weeks gestation of pregnancy: Secondary | ICD-10-CM | POA: Diagnosis not present

## 2022-11-24 DIAGNOSIS — O4202 Full-term premature rupture of membranes, onset of labor within 24 hours of rupture: Secondary | ICD-10-CM | POA: Diagnosis not present

## 2022-11-24 DIAGNOSIS — O9832 Other infections with a predominantly sexual mode of transmission complicating childbirth: Secondary | ICD-10-CM | POA: Diagnosis present

## 2022-11-24 DIAGNOSIS — A6009 Herpesviral infection of other urogenital tract: Secondary | ICD-10-CM | POA: Diagnosis present

## 2022-11-24 DIAGNOSIS — O26893 Other specified pregnancy related conditions, third trimester: Secondary | ICD-10-CM | POA: Diagnosis present

## 2022-11-24 DIAGNOSIS — Z87891 Personal history of nicotine dependence: Secondary | ICD-10-CM | POA: Diagnosis not present

## 2022-11-24 LAB — TYPE AND SCREEN
ABO/RH(D): B POS
Antibody Screen: NEGATIVE

## 2022-11-24 LAB — CBC
HCT: 39.9 % (ref 36.0–46.0)
Hemoglobin: 13.1 g/dL (ref 12.0–15.0)
MCH: 29.6 pg (ref 26.0–34.0)
MCHC: 32.8 g/dL (ref 30.0–36.0)
MCV: 90.1 fL (ref 80.0–100.0)
Platelets: 265 10*3/uL (ref 150–400)
RBC: 4.43 MIL/uL (ref 3.87–5.11)
RDW: 13.1 % (ref 11.5–15.5)
WBC: 8.8 10*3/uL (ref 4.0–10.5)
nRBC: 0 % (ref 0.0–0.2)

## 2022-11-24 LAB — RPR: RPR Ser Ql: NONREACTIVE

## 2022-11-24 MED ORDER — OXYCODONE-ACETAMINOPHEN 5-325 MG PO TABS: 1.0000 | ORAL_TABLET | ORAL | Status: DC | PRN

## 2022-11-24 MED ORDER — COCONUT OIL OIL
1.0000 | TOPICAL_OIL | Status: DC | PRN
  Administered 2022-11-24: 1 via TOPICAL

## 2022-11-24 MED ORDER — BENZOCAINE-MENTHOL 20-0.5 % EX AERO
1.0000 | INHALATION_SPRAY | CUTANEOUS | Status: DC | PRN
  Administered 2022-11-24: 1 via TOPICAL
  Filled 2022-11-24: qty 56

## 2022-11-24 MED ORDER — OXYTOCIN-SODIUM CHLORIDE 30-0.9 UT/500ML-% IV SOLN
2.5000 [IU]/h | INTRAVENOUS | Status: DC
  Administered 2022-11-24: 2.5 [IU]/h via INTRAVENOUS
  Filled 2022-11-24: qty 500

## 2022-11-24 MED ORDER — EPHEDRINE 5 MG/ML INJ: 10.0000 mg | INTRAVENOUS | Status: DC | PRN

## 2022-11-24 MED ORDER — TRANEXAMIC ACID-NACL 1000-0.7 MG/100ML-% IV SOLN
INTRAVENOUS | Status: AC
  Filled 2022-11-24: qty 100

## 2022-11-24 MED ORDER — WITCH HAZEL-GLYCERIN EX PADS: 1.0000 | MEDICATED_PAD | CUTANEOUS | Status: DC | PRN

## 2022-11-24 MED ORDER — MISOPROSTOL 25 MCG QUARTER TABLET
25.0000 ug | ORAL_TABLET | Freq: Once | ORAL | Status: DC
  Filled 2022-11-24: qty 1

## 2022-11-24 MED ORDER — OXYTOCIN-SODIUM CHLORIDE 30-0.9 UT/500ML-% IV SOLN: 1.0000 m[IU]/min | INTRAVENOUS | Status: DC

## 2022-11-24 MED ORDER — SIMETHICONE 80 MG PO CHEW: 80.0000 mg | CHEWABLE_TABLET | ORAL | Status: DC | PRN

## 2022-11-24 MED ORDER — ONDANSETRON HCL 4 MG/2ML IJ SOLN: 4.0000 mg | Freq: Four times a day (QID) | INTRAMUSCULAR | Status: DC | PRN

## 2022-11-24 MED ORDER — DIPHENHYDRAMINE HCL 25 MG PO CAPS: 25.0000 mg | ORAL_CAPSULE | Freq: Four times a day (QID) | ORAL | Status: DC | PRN

## 2022-11-24 MED ORDER — OXYTOCIN BOLUS FROM INFUSION
333.0000 mL | Freq: Once | INTRAVENOUS | Status: AC
  Administered 2022-11-24: 333 mL via INTRAVENOUS

## 2022-11-24 MED ORDER — DIBUCAINE (PERIANAL) 1 % EX OINT: 1.0000 | TOPICAL_OINTMENT | CUTANEOUS | Status: DC | PRN

## 2022-11-24 MED ORDER — TRANEXAMIC ACID-NACL 1000-0.7 MG/100ML-% IV SOLN
1000.0000 mg | INTRAVENOUS | Status: AC
  Administered 2022-11-24: 1000 mg via INTRAVENOUS

## 2022-11-24 MED ORDER — FENTANYL-BUPIVACAINE-NACL 0.5-0.125-0.9 MG/250ML-% EP SOLN: 12.0000 mL/h | EPIDURAL | Status: DC | PRN

## 2022-11-24 MED ORDER — ACETAMINOPHEN 325 MG PO TABS: 650.0000 mg | ORAL_TABLET | ORAL | Status: DC | PRN

## 2022-11-24 MED ORDER — TERBUTALINE SULFATE 1 MG/ML IJ SOLN: 0.2500 mg | Freq: Once | INTRAMUSCULAR | Status: DC | PRN

## 2022-11-24 MED ORDER — LIDOCAINE HCL (PF) 1 % IJ SOLN: 30.0000 mL | INTRAMUSCULAR | Status: DC | PRN

## 2022-11-24 MED ORDER — ONDANSETRON HCL 4 MG PO TABS: 4.0000 mg | ORAL_TABLET | ORAL | Status: DC | PRN

## 2022-11-24 MED ORDER — SOD CITRATE-CITRIC ACID 500-334 MG/5ML PO SOLN: 30.0000 mL | ORAL | Status: DC | PRN

## 2022-11-24 MED ORDER — PHENYLEPHRINE 80 MCG/ML (10ML) SYRINGE FOR IV PUSH (FOR BLOOD PRESSURE SUPPORT): 80.0000 ug | PREFILLED_SYRINGE | INTRAVENOUS | Status: DC | PRN

## 2022-11-24 MED ORDER — LACTATED RINGERS IV SOLN: INTRAVENOUS | Status: DC

## 2022-11-24 MED ORDER — FENTANYL CITRATE (PF) 100 MCG/2ML IJ SOLN
50.0000 ug | INTRAMUSCULAR | Status: DC | PRN
  Administered 2022-11-24 (×2): 100 ug via INTRAVENOUS
  Filled 2022-11-24 (×2): qty 2

## 2022-11-24 MED ORDER — SENNOSIDES-DOCUSATE SODIUM 8.6-50 MG PO TABS
2.0000 | ORAL_TABLET | Freq: Every day | ORAL | Status: DC
  Administered 2022-11-25: 2 via ORAL
  Filled 2022-11-24 (×2): qty 2

## 2022-11-24 MED ORDER — ZOLPIDEM TARTRATE 5 MG PO TABS: 5.0000 mg | ORAL_TABLET | Freq: Every evening | ORAL | Status: DC | PRN

## 2022-11-24 MED ORDER — IBUPROFEN 600 MG PO TABS
600.0000 mg | ORAL_TABLET | Freq: Four times a day (QID) | ORAL | Status: DC
  Administered 2022-11-24 – 2022-11-26 (×9): 600 mg via ORAL
  Filled 2022-11-24 (×9): qty 1

## 2022-11-24 MED ORDER — ACETAMINOPHEN 325 MG PO TABS
650.0000 mg | ORAL_TABLET | ORAL | Status: DC | PRN
  Administered 2022-11-24 (×2): 650 mg via ORAL
  Filled 2022-11-24 (×2): qty 2

## 2022-11-24 MED ORDER — LACTATED RINGERS IV SOLN: 500.0000 mL | INTRAVENOUS | Status: DC | PRN

## 2022-11-24 MED ORDER — ONDANSETRON HCL 4 MG/2ML IJ SOLN: 4.0000 mg | INTRAMUSCULAR | Status: DC | PRN

## 2022-11-24 MED ORDER — MISOPROSTOL 50MCG HALF TABLET: 50.0000 ug | ORAL_TABLET | Freq: Once | ORAL | Status: DC

## 2022-11-24 MED ORDER — PRENATAL MULTIVITAMIN CH
1.0000 | ORAL_TABLET | Freq: Every day | ORAL | Status: DC
  Administered 2022-11-24 – 2022-11-26 (×3): 1 via ORAL
  Filled 2022-11-24 (×3): qty 1

## 2022-11-24 MED ORDER — DIPHENHYDRAMINE HCL 50 MG/ML IJ SOLN: 12.5000 mg | INTRAMUSCULAR | Status: DC | PRN

## 2022-11-24 MED ORDER — LACTATED RINGERS IV SOLN: 500.0000 mL | Freq: Once | INTRAVENOUS | Status: DC

## 2022-11-24 NOTE — Lactation Note (Signed)
This note was copied from a baby's chart. Lactation Consultation Note  Patient Name: Kristy James [id number] Date: 11/24/2022 Age:35 hours Reason for consult: Initial assessment;Term (per mom exp x 3 and baby recently fed just prior to [facility] entering the room. Per mom the feeding was comfortable and swallows.) Mom aware to call with feeding cues.   Maternal Data Does the patient have breastfeeding experience prior to this delivery?: Yes How long did the patient breastfeed?: per mom 1st and 2nd abby 9 months , 3 rd baby 5 months  Feeding Mother's Current Feeding Choice: Breast Milk    Interventions  Education   Discharge Pump: Personal;DEBP (per mom a Spectra)  Consult Status Consult Status: Follow-up Date: 11/24/22 Follow-up type: In-patient    [place] 11/24/2022, 10:33 AM

## 2022-11-24 NOTE — H&P (Cosign Needed Addendum)
OBSTETRIC ADMISSION HISTORY AND PHYSICAL  Kristy James is a 37 y.o. female [phone] with IUP at [redacted]w[redacted]d by LMP presenting for labor. Presented with contractions and found to be 8cm dilated. Have vaginal discharge and no LOF She reports +FMs, No LOF, no VB, no blurry vision, headaches or peripheral edema, and RUQ pain.  She plans on breast feeding. She declined birth control. She received her prenatal care at [facility] (Centering Pregnancy)  Dating: By LMP --->  Estimated Date of Delivery: 11/30/22  Sono:    @[redacted]w[redacted]d , normal anatomy (incomplete), cephalic presentation, posterior placenta, 79%% EFW @[redacted]w[redacted]d , breech, posterior placenta, EFW 60%, AC 53%, normal anatomy   Prenatal History/Complications:  - SMA carrier - History of HSV on valtrex  Past Medical History: Past Medical History:  Diagnosis Date   Herpes genitalis in women    Medical history non-contributory     Past Surgical History: Past Surgical History:  Procedure Laterality Date   NO PAST SURGERIES      Obstetrical History: OB History  Gravida Para Term Preterm AB Living  5 3 3  0 1 3  SAB IAB Ectopic Multiple Live Births  1 0 0   3    # Outcome Date GA Lbr Len/2nd Weight Sex Delivery Anes PTL Lv  5 Current           4 Term 08/29/19 [redacted]w[redacted]d 07:00 / 00:29 2795 g F Vag-Spont None  LIV  3 SAB 2017          2 Term 2012 [redacted]w[redacted]d  3459 g M Vag-Spont EPI  LIV     Birth Comments: none  1 Term 2008 [redacted]w[redacted]d  3487 g F Vag-Spont EPI  LIV     Birth Comments: none    Social History Social History   Socioeconomic History   Marital status: Single    Spouse name: Not on file   Number of children: Not on file   Years of education: Not on file   Highest education level: Not on file  Occupational History   Not on file  Tobacco Use   Smoking status: Former    Types: Cigarettes    Passive exposure: Never   Smokeless tobacco: Never  Vaping Use   Vaping Use: Never used  Substance and Sexual Activity   Alcohol use: Not Currently     Comment: socially   Drug use: No   Sexual activity: Yes    Birth control/protection: None  Other Topics Concern   Not on file  Social History Narrative   ** Merged History Encounter **       Social Determinants of Health   Financial Resource Strain: Not on file  Food Insecurity: No Food Insecurity (01/31/2019)   Hunger Vital Sign    Worried About Running Out of Food in the Last Year: Never true    [place] in the Last Year: Never true  Transportation Needs: No Transportation Needs (01/31/2019)   PRAPARE - Hydrologist (Medical): No    Lack of Transportation (Non-Medical): No  Physical Activity: Not on file  Stress: Not on file  Social Connections: Not on file    Family History: Family History  Problem Relation Age of Onset   Hypotension Mother    Diverticulitis Father    Asthma Neg Hx    Cancer Neg Hx    Diabetes Neg Hx    Heart disease Neg Hx    Hypertension Neg Hx    Stroke  Neg Hx     Allergies: No Known Allergies  Medications Prior to Admission  Medication Sig Dispense Refill Last Dose   Prenatal Vit-Fe Fumarate-FA (PRENATAL MULTIVITAMIN) TABS tablet Take 1 tablet by mouth daily at 12 noon.   11/23/2022   valACYclovir (VALTREX) 500 MG tablet Take 1 tablet (500 mg total) by mouth 2 (two) times daily. 30 tablet 1 11/23/2022   AMBULATORY NON FORMULARY MEDICATION 1 Device by Other route once a week. Blood Pressure Cuff Medium Monitored Regularly at home ICD 10:Z34.90 1 kit 0      Review of Systems   All systems reviewed and negative except as stated in HPI  Blood pressure (!) 137/90, pulse 90, temperature (!) 97.5 F (36.4 C), resp. rate 18, height 5\' 4"  (1.626 m), weight 82.1 kg, last menstrual period 02/23/2022, SpO2 99 %, unknown if currently breastfeeding. General appearance: alert, in no acute distress Lungs: normal work of breathing Heart: normal rate Presentation: cephalic Fetal monitoring: cat 1  Uterine  activity: q 2-3 mins  Dilation: 8 Effacement (%): 90 Station: -2 Exam by:: Blima Singer RNC   Prenatal labs: ABO, Rh: B/Positive/-- (09/19 1120) Antibody: Negative (09/19 1120) Rubella: 2.67 (09/19 1120) RPR: Non Reactive (01/03 0845)  HBsAg: Negative (09/19 1120)  HIV: Non Reactive (01/03 0845)  GBS: Negative/-- (03/11 1647)  1 hr Glucola 72/122/73 Genetic screening  NIPS:   LR female; AFP:   screen neg; Horizon: SMA carrier Anatomy US normal  Prenatal Transfer Tool  Maternal Diabetes: No Genetic Screening: Normal except SMA carrier Maternal Ultrasounds/Referrals: Normal Fetal Ultrasounds or other Referrals:  None Maternal Substance Abuse:  No Significant Maternal Medications:  None Significant Maternal Lab Results:  Group B Strep negative Number of Prenatal Visits:greater than 3 verified prenatal visits Other Comments:  None  No results found for this or any previous visit (from the past 24 hour(s)).  Patient Active Problem List   Diagnosis Date Noted   Post term pregnancy over 40 weeks 11/24/2022   Supervision of other normal pregnancy, antepartum 05/23/2022   Carrier of spinal muscular atrophy 03/17/2019   Herpes genitalis in women     Assessment/Plan:  Kristy James is a 37 y.o. [id number] at [redacted]w[redacted]d here for spontaneous labor. SROM'd to thin mec  #Labor:expectant management. #Pain: Desires epidural, Nitrous oxide #FWB: Cat 1 #ID:  GBS neg #MOF: breast #MOC:declined #Circ:  desired  Seen and discussed with Dr. Concepcion Living. Jiayu "Darlyne Russian, M.D. PGY-2 Family Medicine Visiting Resident Faculty Practice 11/24/2022 3:24 AM   Fellow Attestation  I saw and evaluated the patient, performing the key elements of the service.I  personally performed or re-performed the history, physical exam, and medical decision making activities of this service and have verified that the service and findings are accurately documented in the resident's note. I developed the  management plan that is described in the resident's note, and I agree with the content, with my edits above.    Gifford Shave, MD OB Fellow

## 2022-11-24 NOTE — Lactation Note (Signed)
This note was copied from a baby's chart. Lactation Consultation Note  Patient Name: Kristy James [id number] Date: 11/24/2022 Age:35 hours Reason for consult: Follow-up assessment;Breastfeeding assistance Baby woke up with Dr. Jasmine December. LC F/U and reviewed latching with the football position after hand expressing. Baby latched easily with depth. Multiple swallows and per mom the football position is comfortable.  LC discussed the importance of having the baby learn the depth at the breast.   Maternal Data Has patient been taught Hand Expression?: Yes Does the patient have breastfeeding experience prior to this delivery?: Yes How long did the patient breastfeed?: per mom 1st and 2nd abby 9 months , 3 rd baby 5 months  Feeding Mother's Current Feeding Choice: Breast Milk  LATCH Score Latch: Grasps breast easily, tongue down, lips flanged, rhythmical sucking.  Audible Swallowing: Spontaneous and intermittent  Type of Nipple: Everted at rest and after stimulation  Comfort (Breast/Nipple): Soft / non-tender  Hold (Positioning): Assistance needed to correctly position infant at breast and maintain latch.  LATCH Score: 9   Lactation Tools Discussed/Used    Interventions Interventions: Breast feeding basics reviewed;Assisted with latch;Skin to skin;Breast massage;Hand express;Breast compression;Adjust position;Support pillows;Position options;LC Services brochure  Discharge Pump: Personal;DEBP  Consult Status Consult Status: Follow-up Date: 11/25/22 Follow-up type: In-patient    [place] 11/24/2022, 10:48 AM

## 2022-11-24 NOTE — Progress Notes (Signed)
Circumcision Consent  Discussed with mom at bedside about circumcision.   Circumcision is a surgery that removes the skin that covers the tip of the penis, called the "foreskin." Circumcision is usually done when a boy is between 48 and 40 days old, sometimes up to 16-91 weeks old.  The most common reasons boys are circumcised include for cultural/religious beliefs or for parental preference (potentially easier to clean, so baby looks like daddy, etc).  There may be some medical benefits for circumcision:   Circumcised boys seem to have slightly lower rates of: ? Urinary tract infections (per the American Academy of Pediatrics an uncircumcised boy has a 1/100 chance of developing a UTI in the first year of life, a circumcised boy at a 09/998 chance of developing a UTI in the first year of life- a 10% reduction) ? Penis cancer (typically rare- an uncircumcised female has a 1 in 100,000 chance of developing cancer of the penis) ? Sexually transmitted infection (in endemic areas, including HIV, HPV and Herpes- circumcision does NOT protect against gonorrhea, chlamydia, trachomatis, or syphilis) ? Phimosis: a condition where that makes retraction of the foreskin over the glans impossible (0.4 per 1000 boys per year or 0.6% of boys are affected by their 15th birthday)  Boys and men who are not circumcised can reduce these extra risks by: ? Cleaning their penis well ? Using condoms during sex  What are the risks of circumcision?  As with any surgical procedure, there are risks and complications. In circumcision, complications are rare and usually minor, the most common being: ? Bleeding- risk is reduced by holding each clamp for 30 seconds prior to a cut being made, and by holding pressure after the procedure is done ? Infection- the penis is cleaned prior to the procedure, and the procedure is done under sterile technique ? Damage to the urethra or amputation of the penis  How is circumcision done  in baby boys?  The baby will be placed on a special table and the legs restrained for their safety. Numbing medication is injected into the penis, and the skin is cleansed with betadine to decrease the risk of infection.   What to expect:  The penis will look red and raw for 5-7 days as it heals. We expect scabbing around where the cut was made, as well as clear-pink fluid and some swelling of the penis right after the procedure. If your baby's circumcision starts to bleed or develops pus, please contact your pediatrician immediately.  All questions were answered and mother consented.  Kristy Living, MD 4:31 AM

## 2022-11-24 NOTE — MAU Note (Signed)
RN entered pt's room to attempt IV access. Pt standing at end of bed and refused to get into bed. EFMs off by pt and unable to hear FHTs. Pt did not want to be touched. Repeatedly asked pt to get into bed so she could be transferred to [facility]. Pt finally got into bed but on the very end. Encouraged to move up in bed and pt finally got onto her side and able to hear FHTs at 154. Pt then transferred to 206 by stretcher with Hansel Feinstein CNM, NT, and RN with pt

## 2022-11-24 NOTE — MAU Note (Addendum)
.  Kristy James is a 37 y.o. at [redacted]w[redacted]d here in MAU reporting ctxs since 2230. Has thin d/c and unsure if water broken. Good FM. Denies VB. 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 No signs outbreak. Onset of complaint: 2230 Pain score: 10 Vitals:   11/24/22 0301 11/24/22 0303  BP:  (!) 137/90  Pulse: 90   Resp: 18   Temp:  (!) 97.5 F (36.4 C)  SpO2: 99%      FHT:141 Lab orders placed from triage: mau labor eval

## 2022-11-24 NOTE — Progress Notes (Signed)
FHTs 154 on transfer to [facility] via stetcher

## 2022-11-24 NOTE — Discharge Summary (Signed)
Postpartum Discharge Summary  Date of Service updated***     Patient Name: Kristy James DOB: 1986-04-07 MRN: [id number]  Date of admission: 11/24/2022 Delivery date:11/24/2022  Delivering provider: Concepcion Living  Date of discharge: 11/24/2022  Admitting diagnosis: Post term pregnancy over 40 weeks [O48.0] Intrauterine pregnancy: [redacted]w[redacted]d     Secondary diagnosis:  Principal Problem:   Post term pregnancy over 40 weeks Active Problems:   Herpes genitalis in women   Carrier of spinal muscular atrophy   Supervision of other normal pregnancy, antepartum   Normal labor   Vaginal delivery  Additional problems: ***    Discharge diagnosis: Term Pregnancy Delivered                                              Post partum procedures:{Postpartum procedures:23558} Augmentation:  None Complications: None  Hospital course: Onset of Labor With Vaginal Delivery      37 y.o. yo [id number] at [redacted]w[redacted]d was admitted in Active Labor on 11/24/2022. Labor course was complicated by***  Membrane Rupture Time/Date: 3:43 AM ,11/24/2022   Delivery Method:Vaginal, Spontaneous  Episiotomy: None  Lacerations:  None  Patient had a postpartum course complicated by ***.  She is ambulating, tolerating a regular diet, passing flatus, and urinating well. Patient is discharged home in stable condition on 11/24/22.  Newborn Data: Birth date:11/24/2022  Birth time:4:12 AM  Gender:Female  Living status:Living  Apgars:9 ,9  Weight:   Magnesium Sulfate received: No BMZ received: No Rhophylac:N/A MMR:No T-DaP:Given prenatally Flu: No Transfusion:{Transfusion received:30440034}  Physical exam  Vitals:   11/24/22 0301 11/24/22 0303  BP:  (!) 137/90  Pulse: 90   Resp: 18   Temp:  (!) 97.5 F (36.4 C)  SpO2: 99%   Weight: 82.1 kg   Height: 5\' 4"  (1.626 m)    General: {Exam; general:21111117} Lochia: {Desc; appropriate/inappropriate:30686::"appropriate"} Uterine Fundus: {Desc; firm/soft:30687} Incision:  {Exam; incision:21111123} DVT Evaluation: {Exam; DN:[id number] Labs: Lab Results  Component Value Date   WBC 8.8 11/24/2022   HGB 13.1 11/24/2022   HCT 39.9 11/24/2022   MCV 90.1 11/24/2022   PLT 265 11/24/2022       No data to display         Edinburgh Score:    08/30/2019   10:00 AM  Edinburgh Postnatal Depression Scale Screening Tool  I have been able to laugh and see the funny side of things. 0  I have looked forward with enjoyment to things. 0  I have blamed myself unnecessarily when things went wrong. 0  I have been anxious or worried for no good reason. 0  I have felt scared or panicky for no good reason. 0  Things have been getting on top of me. 0  I have been so unhappy that I have had difficulty sleeping. 0  I have felt sad or miserable. 0  I have been so unhappy that I have been crying. 0  The thought of harming myself has occurred to me. 0  Edinburgh Postnatal Depression Scale Total 0     After visit meds:  Allergies as of 11/24/2022   No Known Allergies   Med Rec must be completed prior to using this [facility]***        Discharge home in stable condition Infant Feeding: {Baby feeding:23562} Infant Disposition:{CHL IP OB HOME WITH [id number] Discharge instruction: per After Visit Summary  and Postpartum booklet. Activity: Advance as tolerated. Pelvic rest for 6 weeks.  Diet: {OB TF:[id number] Future Appointments: Future Appointments  Date Time Provider [place]  12/07/2022  7:15 AM [facility] SCHED ROOM [facility] None   Follow up Visit:   Please schedule this patient for a In person postpartum visit in 6 weeks with the following provider: Any provider. Additional Postpartum F/U: None   Low risk pregnancy complicated by:  None Delivery mode:  Vaginal, Spontaneous  Anticipated Birth Control:   None   11/24/2022 Concepcion Living, MD

## 2022-11-25 NOTE — Progress Notes (Signed)
POSTPARTUM PROGRESS NOTE  Post Partum Day 1  Subjective:  Kristy James is a 37 y.o. [id number] s/p SVD at [redacted]w[redacted]d.  She reports she is doing well. No acute events overnight. She denies any problems with ambulating, voiding or po intake. Denies nausea or vomiting.  Pain is well controlled.  Lochia is appropriate.  Objective: Blood pressure 116/67, pulse 72, temperature 98 F (36.7 C), temperature source Oral, resp. rate 18, height 5\' 4"  (1.626 m), weight 82.1 kg, last menstrual period 02/23/2022, SpO2 99 %, unknown if currently breastfeeding.  Physical Exam:  General: alert, cooperative and no distress Chest: no respiratory distress Heart:regular rate, distal pulses intact Abdomen: soft, nontender,  Uterine Fundus: firm, appropriately tender DVT Evaluation: No calf swelling or tenderness Extremities: trace edema Skin: warm, dry  Recent Labs    11/24/22 0342  HGB 13.1  HCT 39.9    Assessment/Plan: Kristy James is a 37 y.o. [id number] s/p SVD at [redacted]w[redacted]d   PPD#1 - Doing well  Routine postpartum care  Contraception: Declines Feeding: breast Dispo: Plan for discharge tomorrow.   LOS: 1 day   Gifford Shave, MD  OB Fellow  11/25/2022, 7:41 AM

## 2022-11-26 MED ORDER — SENNOSIDES-DOCUSATE SODIUM 8.6-50 MG PO TABS: 2.0000 | ORAL_TABLET | Freq: Every day | ORAL | 0 refills | Status: AC

## 2022-11-26 MED ORDER — ACETAMINOPHEN 325 MG PO TABS: 650.0000 mg | ORAL_TABLET | ORAL | 0 refills | Status: AC | PRN

## 2022-11-26 MED ORDER — BENZOCAINE-MENTHOL 20-0.5 % EX AERO: 1.0000 | INHALATION_SPRAY | CUTANEOUS | 0 refills | Status: AC | PRN

## 2022-11-26 MED ORDER — IBUPROFEN 600 MG PO TABS: 600.0000 mg | ORAL_TABLET | Freq: Four times a day (QID) | ORAL | 0 refills | Status: AC

## 2022-11-26 NOTE — Lactation Note (Signed)
This note was copied from a baby's chart. Lactation Consultation Note  Patient Name: Kristy James [id number] Date: 11/26/2022 Age:35 hours Reason for consult: Follow-up assessment;Term;Infant weight loss (LC reviewed the doc flow sheets /WNL for D/C . LC reviewed BF D/C teaching the [facility] resources , per mom BF is going well .)   Maternal Data    Feeding Mother's Current Feeding Choice: Breast Milk  LATCH Score                    Lactation Tools Discussed/Used    Interventions    Discharge Discharge Education: Engorgement and breast care;Warning signs for feeding baby Pump: Personal;DEBP  Consult Status Consult Status: Complete Date: 11/26/22    [name] 11/26/2022, 2:57 PM

## 2022-11-26 NOTE — Lactation Note (Signed)
This note was copied from a baby's chart. Lactation Consultation Note  Patient Name: Kristy James [id number] Date: 11/26/2022 Age:35 hours Reason for consult: Follow-up assessment;Term;Infant weight loss (LC reviewed the doc flow sheets /WNL for D/C . LC reviewed BF D/C teaching the [facility] resources , per mom BF is going well .)   Maternal Data    Feeding Mother's Current Feeding Choice: Breast Milk   Interventions  Education   Discharge Discharge Education: Engorgement and breast care;Warning signs for feeding baby Pump: Personal;DEBP  Consult Status Consult Status: Complete Date: 11/26/22    [name] 11/26/2022, 2:57 PM

## 2022-12-07 ENCOUNTER — Telehealth (HOSPITAL_COMMUNITY): Payer: Self-pay | Admitting: *Deleted

## 2022-12-07 ENCOUNTER — Inpatient Hospital Stay (HOSPITAL_COMMUNITY): Payer: Medicaid Other

## 2022-12-07 ENCOUNTER — Inpatient Hospital Stay (HOSPITAL_COMMUNITY): Admission: AD | Admit: 2022-12-07 | Payer: Medicaid Other | Source: Home / Self Care | Admitting: Family Medicine

## 2022-12-07 ENCOUNTER — Telehealth (HOSPITAL_COMMUNITY): Payer: Self-pay

## 2022-12-07 NOTE — Telephone Encounter (Signed)
Mom would like a call back later as she is running an errand at present.  Odis Hollingshead, RN 12-07-2022 at 2;51pm

## 2022-12-07 NOTE — Telephone Encounter (Signed)
Repeat Attempt at [facility] Discharge Follow-Up Call.  Left voice mail requesting that patient return RN's phone call if patient has any concerns or questions.

## 2022-12-07 NOTE — Telephone Encounter (Signed)
Chart review.

## 2023-01-05 ENCOUNTER — Ambulatory Visit (INDEPENDENT_AMBULATORY_CARE_PROVIDER_SITE_OTHER): Payer: Self-pay | Admitting: Certified Nurse Midwife

## 2023-01-05 NOTE — Progress Notes (Signed)
Patient did not show for this visit today  Kristy James Danella Deis) Suzie Portela, MSN, CNM  [facility]  01/05/23 2:55 PM
